# Patient Record
Sex: Male | Born: 1980 | Race: Black or African American | Hispanic: No | Marital: Single | Smoking: Current every day smoker
Health system: Southern US, Community
[De-identification: ages and names within clinical notes are randomized; demographics above are authoritative.]

## PROBLEM LIST (undated history)

## (undated) DIAGNOSIS — L509 Urticaria, unspecified: Secondary | ICD-10-CM

## (undated) DIAGNOSIS — K4090 Unilateral inguinal hernia, without obstruction or gangrene, not specified as recurrent: Secondary | ICD-10-CM

## (undated) HISTORY — PX: TENDON REPAIR: SHX5111

---

## 2009-05-15 ENCOUNTER — Emergency Department (HOSPITAL_BASED_OUTPATIENT_CLINIC_OR_DEPARTMENT_OTHER): Admission: EM | Admit: 2009-05-15 | Discharge: 2009-05-15 | Payer: Self-pay | Admitting: Emergency Medicine

## 2009-05-15 ENCOUNTER — Ambulatory Visit: Payer: Self-pay | Admitting: Radiology

## 2010-08-06 ENCOUNTER — Emergency Department (INDEPENDENT_AMBULATORY_CARE_PROVIDER_SITE_OTHER): Payer: Self-pay

## 2010-08-06 ENCOUNTER — Emergency Department (HOSPITAL_BASED_OUTPATIENT_CLINIC_OR_DEPARTMENT_OTHER)
Admission: EM | Admit: 2010-08-06 | Discharge: 2010-08-06 | Disposition: A | Discharge status: Home / Self Care | Payer: Self-pay | Attending: Emergency Medicine | Admitting: Emergency Medicine

## 2010-08-06 DIAGNOSIS — W19XXXA Unspecified fall, initial encounter: Secondary | ICD-10-CM

## 2010-08-06 DIAGNOSIS — Y9289 Other specified places as the place of occurrence of the external cause: Secondary | ICD-10-CM | POA: Insufficient documentation

## 2010-08-06 DIAGNOSIS — M25539 Pain in unspecified wrist: Secondary | ICD-10-CM

## 2010-08-06 DIAGNOSIS — S63509A Unspecified sprain of unspecified wrist, initial encounter: Secondary | ICD-10-CM | POA: Insufficient documentation

## 2010-08-16 ENCOUNTER — Emergency Department (HOSPITAL_COMMUNITY): Payer: Self-pay

## 2010-08-16 ENCOUNTER — Encounter (HOSPITAL_COMMUNITY): Payer: Self-pay

## 2010-08-16 ENCOUNTER — Emergency Department (HOSPITAL_COMMUNITY)
Admission: EM | Admit: 2010-08-16 | Discharge: 2010-08-16 | Disposition: A | Discharge status: Home / Self Care | Payer: Self-pay | Attending: Emergency Medicine | Admitting: Emergency Medicine

## 2010-08-16 DIAGNOSIS — M25579 Pain in unspecified ankle and joints of unspecified foot: Secondary | ICD-10-CM | POA: Insufficient documentation

## 2010-08-16 DIAGNOSIS — R209 Unspecified disturbances of skin sensation: Secondary | ICD-10-CM | POA: Insufficient documentation

## 2010-08-16 DIAGNOSIS — Z87828 Personal history of other (healed) physical injury and trauma: Secondary | ICD-10-CM | POA: Insufficient documentation

## 2011-01-17 ENCOUNTER — Emergency Department (HOSPITAL_COMMUNITY): Payer: Self-pay

## 2011-01-17 ENCOUNTER — Emergency Department (HOSPITAL_COMMUNITY)
Admission: EM | Admit: 2011-01-17 | Discharge: 2011-01-17 | Disposition: A | Discharge status: Home / Self Care | Payer: Self-pay | Attending: Emergency Medicine | Admitting: Emergency Medicine

## 2011-01-17 DIAGNOSIS — R509 Fever, unspecified: Secondary | ICD-10-CM | POA: Insufficient documentation

## 2011-01-17 DIAGNOSIS — R059 Cough, unspecified: Secondary | ICD-10-CM | POA: Insufficient documentation

## 2011-01-17 DIAGNOSIS — M940 Chondrocostal junction syndrome [Tietze]: Secondary | ICD-10-CM | POA: Insufficient documentation

## 2011-01-17 DIAGNOSIS — R05 Cough: Secondary | ICD-10-CM | POA: Insufficient documentation

## 2011-01-17 LAB — DIFFERENTIAL
Basophils Absolute: 0 10*3/uL (ref 0.0–0.1)
Basophils Relative: 0 % (ref 0–1)
Eosinophils Absolute: 0.1 10*3/uL (ref 0.0–0.7)
Eosinophils Relative: 1 % (ref 0–5)
Neutrophils Relative %: 78 % — ABNORMAL HIGH (ref 43–77)

## 2011-01-17 LAB — CBC
MCH: 30.9 pg (ref 26.0–34.0)
RBC: 5.21 MIL/uL (ref 4.22–5.81)
WBC: 13.9 10*3/uL — ABNORMAL HIGH (ref 4.0–10.5)

## 2011-01-17 LAB — COMPREHENSIVE METABOLIC PANEL
ALT: 11 U/L (ref 0–53)
BUN: 12 mg/dL (ref 6–23)
CO2: 28 mEq/L (ref 19–32)
Calcium: 9.3 mg/dL (ref 8.4–10.5)
Chloride: 97 mEq/L (ref 96–112)
GFR calc Af Amer: 74 mL/min — ABNORMAL LOW (ref >90)
Potassium: 4 mEq/L (ref 3.5–5.1)
Sodium: 135 mEq/L (ref 135–145)
Total Protein: 7.5 g/dL (ref 6.0–8.3)

## 2011-01-17 LAB — CK TOTAL AND CKMB (NOT AT ARMC)
CK, MB: 1.1 ng/mL (ref 0.3–4.0)
Total CK: 211 U/L (ref 7–232)

## 2011-01-17 LAB — TROPONIN I: Troponin I: <0.3 ng/mL (ref <0.30)

## 2011-01-17 MED ORDER — IOHEXOL 300 MG/ML  SOLN
80.0000 mL | Freq: Once | INTRAMUSCULAR | Status: AC | PRN
  Administered 2011-01-17: 80 mL via INTRAVENOUS

## 2011-12-29 ENCOUNTER — Encounter (HOSPITAL_COMMUNITY): Payer: Self-pay | Admitting: *Deleted

## 2011-12-29 ENCOUNTER — Emergency Department (HOSPITAL_COMMUNITY)
Admission: EM | Admit: 2011-12-29 | Discharge: 2011-12-29 | Disposition: A | Discharge status: Home / Self Care | Payer: Self-pay | Attending: Emergency Medicine | Admitting: Emergency Medicine

## 2011-12-29 DIAGNOSIS — K0889 Other specified disorders of teeth and supporting structures: Secondary | ICD-10-CM

## 2011-12-29 DIAGNOSIS — K029 Dental caries, unspecified: Secondary | ICD-10-CM | POA: Insufficient documentation

## 2011-12-29 DIAGNOSIS — F17200 Nicotine dependence, unspecified, uncomplicated: Secondary | ICD-10-CM | POA: Insufficient documentation

## 2011-12-29 MED ORDER — HYDROCODONE-ACETAMINOPHEN 5-500 MG PO TABS: 1.0000 | ORAL_TABLET | Freq: Four times a day (QID) | ORAL | Status: DC | PRN

## 2011-12-29 MED ORDER — OXYCODONE-ACETAMINOPHEN 5-325 MG PO TABS
1.0000 | ORAL_TABLET | Freq: Once | ORAL | Status: AC
  Administered 2011-12-29: 1 via ORAL

## 2011-12-29 MED ORDER — IBUPROFEN 800 MG PO TABS: 800.0000 mg | ORAL_TABLET | Freq: Three times a day (TID) | ORAL | Status: DC

## 2011-12-29 MED ORDER — PENICILLIN V POTASSIUM 500 MG PO TABS: 500.0000 mg | ORAL_TABLET | Freq: Four times a day (QID) | ORAL | Status: DC

## 2011-12-29 MED ORDER — PENICILLIN V POTASSIUM 500 MG PO TABS: 500.0000 mg | ORAL_TABLET | Freq: Four times a day (QID) | ORAL | Status: AC

## 2011-12-29 NOTE — ED Notes (Signed)
Pt c/o top left jaw toothache

## 2011-12-29 NOTE — ED Provider Notes (Signed)
Medical screening examination/treatment/procedure(s) were performed by non-physician practitioner and as supervising physician I was immediately available for consultation/collaboration.   Charles B. Bernette Mayers, MD 12/29/11 2109

## 2011-12-29 NOTE — ED Notes (Signed)
Pt c/o pain to L upper Jaw, broken teeth noted. Pt states he no longer has insurance unable to see dentist. Pt severe onset this morning.

## 2011-12-29 NOTE — ED Provider Notes (Signed)
History     CSN: [id number]  Arrival date & time 12/29/11  0630   First MD Initiated Contact with Patient 12/29/11 [phone]      Chief Complaint  Patient presents with  . Dental Pain    (Consider location/radiation/quality/duration/timing/severity/associated sxs/prior treatment) Patient is a 31 y.o. male presenting with tooth pain. The history is provided by the patient.  Dental PainThe primary symptoms include dental injury. Primary symptoms do not include mouth pain, headaches, fever, shortness of breath, sore throat or cough. The symptoms began 2 to 6 hours ago. The symptoms are worsening. The symptoms are new. The symptoms occur constantly.  Additional symptoms include: gum swelling and gum tenderness. Additional symptoms do not include: facial swelling, trouble swallowing and ear pain.  Pt with dental pain to the left upper 2nd molar onset this morning. States broke that tooth about a month ago eating peanuts. Denies facial swelling, fever, chills. Took tylenol with no relief.   History reviewed. No pertinent past medical history.  History reviewed. No pertinent past surgical history.  No family history on file.  History  Substance Use Topics  . Smoking status: Current Every Day Smoker  . Smokeless tobacco: Not on file  . Alcohol Use: Yes      Review of Systems  Constitutional: Negative for fever and chills.  HENT: Positive for dental problem. Negative for ear pain, sore throat, facial swelling and trouble swallowing.   Respiratory: Negative for cough, chest tightness and shortness of breath.   Cardiovascular: Negative.   Neurological: Negative for headaches.  Hematological: Negative for adenopathy.    Allergies  Review of patient's allergies indicates no known allergies.  Home Medications   Current Outpatient Rx  Name Route Sig Dispense Refill  . ACETAMINOPHEN 500 MG PO TABS Oral Take 1,000 mg by mouth every 6 (six) hours as needed. Pain      BP 117/82  Pulse  104  Temp 97.9 F (36.6 C)  Resp 20  SpO2 99%  Physical Exam  Vitals reviewed. Constitutional: He is oriented to person, place, and time. He appears well-developed and well-nourished. No distress.  HENT:       Poor dentition. Multiple carries. Left upper 2nd molar partially broken, tender to palpation. No facial swelling. No lymphadenopathy  Eyes: Conjunctivae normal are normal.  Neck: Neck supple.  Cardiovascular: Normal rate, regular rhythm and normal heart sounds.   Pulmonary/Chest: Effort normal and breath sounds normal. No respiratory distress. He has no wheezes. He has no rales.  Musculoskeletal: He exhibits no edema.  Neurological: He is alert and oriented to person, place, and time.  Skin: Skin is warm and dry.  Psychiatric: He has a normal mood and affect.    ED Course  Procedures (including critical care time)  Pt with dental carries, possible early abscess. Pain meds, antibiotic, follow up with a dentist.   1. Pain, dental       MDM          Lottie Mussel, PA 12/29/11 1529

## 2012-01-25 ENCOUNTER — Encounter (HOSPITAL_COMMUNITY): Payer: Self-pay | Admitting: Emergency Medicine

## 2012-01-25 ENCOUNTER — Emergency Department (HOSPITAL_COMMUNITY)
Admission: EM | Admit: 2012-01-25 | Discharge: 2012-01-25 | Disposition: A | Discharge status: Home / Self Care | Payer: Self-pay | Attending: Emergency Medicine | Admitting: Emergency Medicine

## 2012-01-25 DIAGNOSIS — K029 Dental caries, unspecified: Secondary | ICD-10-CM | POA: Insufficient documentation

## 2012-01-25 DIAGNOSIS — F17200 Nicotine dependence, unspecified, uncomplicated: Secondary | ICD-10-CM | POA: Insufficient documentation

## 2012-01-25 DIAGNOSIS — L509 Urticaria, unspecified: Secondary | ICD-10-CM | POA: Insufficient documentation

## 2012-01-25 MED ORDER — DIPHENHYDRAMINE HCL 25 MG PO TABS: 25.0000 mg | ORAL_TABLET | Freq: Four times a day (QID) | ORAL | Status: DC | PRN

## 2012-01-25 MED ORDER — PREDNISONE 20 MG PO TABS: 40.0000 mg | ORAL_TABLET | Freq: Every day | ORAL | Status: DC

## 2012-01-25 NOTE — ED Notes (Signed)
Pt alert no distress 

## 2012-01-25 NOTE — ED Notes (Signed)
Pt c/o generalized rash with itching x 1 week

## 2012-01-25 NOTE — ED Provider Notes (Signed)
History   This chart was scribed for Tobin Chad, MD by Albertha Ghee Rifaie. This patient was seen in room TR07C/TR07C and the patient's care was started at 6:10 PM.    CSN: [id number]  Arrival date & time 01/25/12  1641   None     Chief Complaint  Patient presents with  . Rash    The history is provided by the patient. No language interpreter was used.    Robert Jennings is a 31 y.o. male who presents to the Emergency Department complaining of one week of gradual onset, gradually worsening, generalized rash associated with itching. That rash started in his right thigh. He denies having similar symptoms before. He denies having any chronic health problems. Pt also denies fever, chills, SOB,  nausea, and emesis as associated symptoms. He states taking benadryl that made the rash disappear and then it came back. Pt is a current everyday smoker and occasional alcohol user.  He reports he is taking penicillin for a dental infection and has 2 days remaining of a 7 day course.  The rash was present prior to starting the PCN.  He also states he had some mouth and throat itching but only after consuming apple juice.  It resolved shortly after he noticed the symptoms.    History reviewed. No pertinent past medical history.  History reviewed. No pertinent past surgical history.  History reviewed. No pertinent family history.  History  Substance Use Topics  . Smoking status: Current Every Day Smoker  . Smokeless tobacco: Not on file  . Alcohol Use: Yes      Review of Systems  Constitutional: Negative.   HENT: Negative.  Negative for sore throat, drooling, trouble swallowing and voice change.   Eyes: Negative.   Respiratory: Negative.   Cardiovascular: Negative.   Gastrointestinal: Negative.   Genitourinary: Negative.   Musculoskeletal: Negative.   Skin: Positive for color change and rash.  Neurological: Negative.   Hematological: Negative.   Psychiatric/Behavioral: Negative.     All other systems reviewed and are negative.    Allergies  Review of patient's allergies indicates no known allergies.  Home Medications   Current Outpatient Rx  Name Route Sig Dispense Refill  . ACETAMINOPHEN 500 MG PO TABS Oral Take 1,000 mg by mouth every 6 (six) hours as needed. Pain    . HYDROCODONE-ACETAMINOPHEN 5-500 MG PO TABS Oral Take 1-2 tablets by mouth every 6 (six) hours as needed for pain. 15 tablet 0  . IBUPROFEN 800 MG PO TABS Oral Take 1 tablet (800 mg total) by mouth 3 (three) times daily. 21 tablet 0    BP 124/88  Pulse 75  Temp 98.5 F (36.9 C) (Oral)  Resp 18  SpO2 97%  Physical Exam  Nursing note and vitals reviewed. Constitutional: He is oriented to person, place, and time. He appears well-developed and well-nourished. No distress.  HENT:  Head: Normocephalic and atraumatic. Head is without raccoon's eyes, without contusion, without right periorbital erythema and without left periorbital erythema. No trismus in the jaw.  Right Ear: External ear normal. No mastoid tenderness.  Left Ear: External ear normal. No mastoid tenderness.  Nose: Nose normal. No mucosal edema or sinus tenderness. No epistaxis. Right sinus exhibits no maxillary sinus tenderness and no frontal sinus tenderness. Left sinus exhibits no maxillary sinus tenderness and no frontal sinus tenderness.  Mouth/Throat: Uvula is midline and mucous membranes are normal. Mucous membranes are not pale, not dry and not cyanotic. He does not have  dentures. No oral lesions. Abnormal dentition. Dental caries present. No dental abscesses, uvula swelling or lacerations. No oropharyngeal exudate, posterior oropharyngeal edema, posterior oropharyngeal erythema or tonsillar abscesses.  Neck: Normal range of motion. Neck supple. No JVD present. No tracheal deviation present.  Cardiovascular: Normal rate, regular rhythm, normal heart sounds and intact distal pulses.  Exam reveals no gallop.   No murmur  heard. Pulmonary/Chest: Effort normal and breath sounds normal. No stridor. No respiratory distress. He has no wheezes. He has no rales. He exhibits no tenderness.  Abdominal: Soft. Bowel sounds are normal. He exhibits no distension. There is no tenderness. There is no rebound and no guarding.  Musculoskeletal: Normal range of motion. He exhibits no edema and no tenderness.  Lymphadenopathy:    He has no cervical adenopathy.  Neurological: He is alert and oriented to person, place, and time.  Skin: Skin is warm and dry. Rash noted. No abrasion, no bruising, no burn, no ecchymosis, no laceration, no petechiae and no purpura noted. Rash is urticarial (generalized on trunck, extremities, and face.). Rash is not macular, not papular, not maculopapular, not nodular, not pustular and not vesicular. He is not diaphoretic. No erythema. No pallor.  Psychiatric: He has a normal mood and affect. His behavior is normal.    ED Course  Procedures (including critical care time)  Labs Reviewed - No data to display No results found.  DIAGNOSTIC STUDIES: Oxygen Saturation is 97% on room air, adequate by my interpretation.    COORDINATION OF CARE: 6:14 PM Discussed treatment plan with pt at bedside and pt agreed to plan.    No diagnosis found.    MDM  Pt presents for evaluation of a rash.  Note diffuse urticaria without evidence of airway involvement or anaphylaxis.  He has used benadryl and noted improvement in the itching but the rash persists.  He cannot think of any agent that may have caused the rash.  Plan prn benadryl + a prednisone pulse.  Encouraged outpt follow-up.      I personally performed the services described in this documentation, which was scribed in my presence. The recorded information has been reviewed and considered.     Tobin Chad, MD 01/25/12 1900

## 2012-02-05 ENCOUNTER — Encounter (HOSPITAL_COMMUNITY): Payer: Self-pay

## 2012-02-05 ENCOUNTER — Emergency Department (HOSPITAL_COMMUNITY)
Admission: EM | Admit: 2012-02-05 | Discharge: 2012-02-05 | Disposition: A | Discharge status: Home / Self Care | Payer: Self-pay | Attending: Emergency Medicine | Admitting: Emergency Medicine

## 2012-02-05 DIAGNOSIS — F17200 Nicotine dependence, unspecified, uncomplicated: Secondary | ICD-10-CM | POA: Insufficient documentation

## 2012-02-05 DIAGNOSIS — Z79899 Other long term (current) drug therapy: Secondary | ICD-10-CM | POA: Insufficient documentation

## 2012-02-05 DIAGNOSIS — L509 Urticaria, unspecified: Secondary | ICD-10-CM | POA: Insufficient documentation

## 2012-02-05 MED ORDER — DIPHENHYDRAMINE HCL 50 MG/ML IJ SOLN
50.0000 mg | Freq: Once | INTRAMUSCULAR | Status: AC
  Administered 2012-02-05: 50 mg via INTRAVENOUS
  Filled 2012-02-05: qty 1

## 2012-02-05 MED ORDER — PREDNISONE 10 MG PO TABS: 60.0000 mg | ORAL_TABLET | Freq: Every day | ORAL | Status: DC

## 2012-02-05 MED ORDER — PERMETHRIN 5 % EX CREA: TOPICAL_CREAM | CUTANEOUS | Status: DC

## 2012-02-05 MED ORDER — DIPHENHYDRAMINE HCL 25 MG PO CAPS: 25.0000 mg | ORAL_CAPSULE | Freq: Four times a day (QID) | ORAL | Status: DC | PRN

## 2012-02-05 MED ORDER — METHYLPREDNISOLONE SODIUM SUCC 125 MG IJ SOLR
125.0000 mg | Freq: Once | INTRAMUSCULAR | Status: AC
  Administered 2012-02-05: 125 mg via INTRAVENOUS
  Filled 2012-02-05: qty 2

## 2012-02-05 MED ORDER — FAMOTIDINE 20 MG PO TABS
20.0000 mg | ORAL_TABLET | Freq: Once | ORAL | Status: AC
  Administered 2012-02-05: 20 mg via ORAL
  Filled 2012-02-05: qty 1

## 2012-02-05 MED ORDER — FAMOTIDINE 20 MG PO TABS: 20.0000 mg | ORAL_TABLET | Freq: Two times a day (BID) | ORAL | Status: DC

## 2012-02-05 NOTE — ED Notes (Signed)
Visitor at bedside.

## 2012-02-05 NOTE — ED Notes (Signed)
Pt. Was here 2 weeks ago for these hives.  Pt. Was placed on Prednisone.  Completed yesterday and the hives came back this morning.  All over his arms,  Face,  Legs and pt. Reports  That it feels funny swallowing.  Throat is red uvula is slightly swollen. Airway patent presently

## 2012-02-05 NOTE — ED Notes (Signed)
Here several weeks ago for same problem; pt. Was prescribed prednisone and benadryl which has been ineffective.

## 2012-02-05 NOTE — ED Provider Notes (Signed)
History     CSN: [id number]  Arrival date & time 02/05/12  0865   First MD Initiated Contact with Patient 02/05/12 [phone]      Chief Complaint  Patient presents with  . Urticaria    HPI The patient reports persistent hives over the past month.  He was seen emergency department 2 weeks ago was treated with Benadryl and steroids reports marked improvement in his symptoms.  After finishing his prednisone he reports his hives returned.  He has no difficulty breathing or swallowing.  He's never had allergic reactions before.  He denies food or drug allergies.  He cannot think of any new drugs or foods that he has ingested.  Interestingly he feels like his symptoms started shortly after staying at a motel.  He does not know of any fleas or scabies or anyone else with similar symptoms.   History reviewed. No pertinent past medical history.  History reviewed. No pertinent past surgical history.  History reviewed. No pertinent family history.  History  Substance Use Topics  . Smoking status: Current Every Day Smoker  . Smokeless tobacco: Not on file  . Alcohol Use: Yes      Review of Systems  All other systems reviewed and are negative.    Allergies  Review of patient's allergies indicates no known allergies.  Home Medications   Current Outpatient Rx  Name  Route  Sig  Dispense  Refill  . ACETAMINOPHEN 500 MG PO TABS   Oral   Take 1,000 mg by mouth every 6 (six) hours as needed. Pain         . DIPHENHYDRAMINE HCL 25 MG PO TABS   Oral   Take 1 tablet (25 mg total) by mouth every 6 (six) hours as needed for itching.   20 tablet   0   . PREDNISONE 20 MG PO TABS   Oral   Take 2 tablets (40 mg total) by mouth daily. Take 40 mg by mouth daily for 3 days, then 20mg  by mouth daily for 3 days, then 10mg  daily for 3 days   12 tablet   0   . TRAMADOL HCL 50 MG PO TABS   Oral   Take 50 mg by mouth every 6 (six) hours as needed. For pain         . DIPHENHYDRAMINE HCL 25  MG PO CAPS   Oral   Take 1 capsule (25 mg total) by mouth every 6 (six) hours as needed for itching.   30 capsule   0   . FAMOTIDINE 20 MG PO TABS   Oral   Take 1 tablet (20 mg total) by mouth 2 (two) times daily.   30 tablet   0   . PERMETHRIN 5 % EX CREA      Please apply the cream to your entire body surface and leave on for 8-10 hours, then rinse off.  Please launder all clothes, towels, and bed sheets in hot water.  Repeat same procedure one week later   60 g   0   . PREDNISONE 10 MG PO TABS   Oral   Take 6 tablets (60 mg total) by mouth daily.   30 tablet   0     BP 96/61  Pulse 73  Temp 97.8 F (36.6 C) (Oral)  Resp 18  Ht 6\' 2"  (1.88 m)  Wt 200 lb (90.719 kg)  BMI 25.68 kg/m2  SpO2 100%  Physical Exam  Nursing note and vitals  reviewed. Constitutional: He is oriented to person, place, and time. He appears well-developed and well-nourished.  HENT:  Head: Normocephalic and atraumatic.  Eyes: EOM are normal.  Neck: Normal range of motion.  Cardiovascular: Normal rate, regular rhythm, normal heart sounds and intact distal pulses.   Pulmonary/Chest: Effort normal and breath sounds normal. No respiratory distress.  Abdominal: Soft. He exhibits no distension. There is no tenderness.  Musculoskeletal: Normal range of motion.  Neurological: He is alert and oriented to person, place, and time.  Skin: Skin is warm and dry.       Diffuse urticaria over his face arms chest trunk and legs.  Psychiatric: He has a normal mood and affect. Judgment normal.    ED Course  Procedures (including critical care time)  Labs Reviewed - No data to display No results found.   1. Urticaria       MDM  12:37 PM Patient has had complete resolution of his symptoms after Pepcid Benadryl and steroids.  In these areas there still are small bumps and therefore the fact that this has been persistent for one month makes me consider the possibilities of scabies her bed bugs.  I am  not completely convinced this is the cause however the patient will be treated for possible scabies exposure.  Have also recommended treatment for allergic reaction and followup within allergists.        Lyanne Co, MD 02/05/12 [phone]

## 2012-02-05 NOTE — ED Notes (Signed)
Pt. States, "stayed at dirty motel the day before spotty redness in lower legs, and now all over." Dr. Bevely Palmer ware.

## 2012-02-13 ENCOUNTER — Emergency Department (HOSPITAL_COMMUNITY)
Admission: EM | Admit: 2012-02-13 | Discharge: 2012-02-13 | Disposition: A | Discharge status: Home / Self Care | Payer: Self-pay | Attending: Emergency Medicine | Admitting: Emergency Medicine

## 2012-02-13 ENCOUNTER — Encounter (HOSPITAL_COMMUNITY): Payer: Self-pay | Admitting: *Deleted

## 2012-02-13 DIAGNOSIS — L299 Pruritus, unspecified: Secondary | ICD-10-CM | POA: Insufficient documentation

## 2012-02-13 DIAGNOSIS — L509 Urticaria, unspecified: Secondary | ICD-10-CM | POA: Insufficient documentation

## 2012-02-13 DIAGNOSIS — F17200 Nicotine dependence, unspecified, uncomplicated: Secondary | ICD-10-CM | POA: Insufficient documentation

## 2012-02-13 HISTORY — DX: Urticaria, unspecified: L50.9

## 2012-02-13 MED ORDER — PREDNISONE 20 MG PO TABS: 20.0000 mg | ORAL_TABLET | Freq: Every day | ORAL | Status: DC

## 2012-02-13 MED ORDER — HYDROXYZINE HCL 10 MG PO TABS
10.0000 mg | ORAL_TABLET | Freq: Once | ORAL | Status: AC
  Administered 2012-02-13: 10 mg via ORAL
  Filled 2012-02-13: qty 1

## 2012-02-13 MED ORDER — PREDNISONE 20 MG PO TABS
40.0000 mg | ORAL_TABLET | Freq: Once | ORAL | Status: AC
  Administered 2012-02-13: 40 mg via ORAL
  Filled 2012-02-13: qty 2

## 2012-02-13 NOTE — ED Provider Notes (Signed)
History     CSN: [id number]  Arrival date & time 02/13/12  0149   First MD Initiated Contact with Patient 02/13/12 [phone]      Chief Complaint  Patient presents with  . Urticaria  . Pruritis    (Consider location/radiation/quality/duration/timing/severity/associated sxs/prior treatment) HPI Comments: 31 year old male with a history of allergic reaction and urticaria which she has experienced over the last month. He has been to the hospital twice before for the same symptoms, no etiology was found and he has had persistent urticaria when he stops taking prednisone. His last dose of prednisone was approximately 22 hours prior to arrival, he states that this evening his symptoms recurred, he is out of his medications, he denies any swelling of his throat, wheezing, coughing. The symptoms are mild, persistent, associated with pruritus. The patient denies to me any new topical exposures, food exposures, medication exposures. He denies any intraoral lesions or optic lesions.  Patient is a 31 y.o. male presenting with urticaria. The history is provided by the patient and medical records.  Urticaria    Past Medical History  Diagnosis Date  . Urticaria     History reviewed. No pertinent past surgical history.  No family history on file.  History  Substance Use Topics  . Smoking status: Current Every Day Smoker  . Smokeless tobacco: Not on file  . Alcohol Use: Yes      Review of Systems  All other systems reviewed and are negative.    Allergies  Review of patient's allergies indicates no known allergies.  Home Medications   Current Outpatient Rx  Name  Route  Sig  Dispense  Refill  . DIPHENHYDRAMINE HCL 25 MG PO CAPS   Oral   Take 1 capsule (25 mg total) by mouth every 6 (six) hours as needed for itching.   30 capsule   0   . FAMOTIDINE 20 MG PO TABS   Oral   Take 1 tablet (20 mg total) by mouth 2 (two) times daily.   30 tablet   0   . PREDNISONE 10 MG PO TABS  Oral   Take 6 tablets (60 mg total) by mouth daily.   30 tablet   0   . PREDNISONE 20 MG PO TABS   Oral   Take 1 tablet (20 mg total) by mouth daily.   60 tablet   1     BP 130/66  Pulse 111  Temp 98.2 F (36.8 C) (Oral)  Resp 20  SpO2 95%  Physical Exam  Nursing note and vitals reviewed. Constitutional: He appears well-developed and well-nourished. No distress.  HENT:  Head: Normocephalic and atraumatic.  Mouth/Throat: Oropharynx is clear and moist. No oropharyngeal exudate.  Eyes: Conjunctivae normal and EOM are normal. Pupils are equal, round, and reactive to light. Right eye exhibits no discharge. Left eye exhibits no discharge. No scleral icterus.  Neck: Normal range of motion. Neck supple. No JVD present. No thyromegaly present.  Cardiovascular: Normal rate, regular rhythm, normal heart sounds and intact distal pulses.  Exam reveals no gallop and no friction rub.   No murmur heard. Pulmonary/Chest: Effort normal and breath sounds normal. No respiratory distress. He has no wheezes. He has no rales.  Abdominal: Soft. Bowel sounds are normal. He exhibits no distension and no mass. There is no tenderness.  Musculoskeletal: Normal range of motion. He exhibits no edema and no tenderness.  Lymphadenopathy:    He has no cervical adenopathy.  Neurological: He is alert. Coordination  normal.  Skin: Skin is warm and dry.       Scattered small urticarial lesions across his trunk, upper extremities and back.  Psychiatric: He has a normal mood and affect. His behavior is normal.    ED Course  Procedures (including critical care time)  Labs Reviewed - No data to display No results found.   1. Urticaria       MDM  Overall the patient is well-appearing, his heart rate has reduced to 90 on my exam, he has no intraoral symptoms, no pulmonary symptoms and has only skin manifestations of what appears to be an urticarial reaction. Prednisone given, allergist recommended and  strongly urged the patient to followup this week. The patient has expressed his understanding for the indications for return to        Vida Roller, MD 02/13/12 [phone]

## 2012-02-13 NOTE — ED Notes (Addendum)
C/o hives and itching, ongoing intermitantly for ~ 1 month, has been seen x2 before in this ED, not able to f/u with allergist, has been taking prednisone (finished), and benadryl and pepcid. meds wear off and hives and itching return. This episode stareed ~ 2 hrs ago. Last pepcid and benadryl at 0000 am. Unsure of allergen. Pt scratching. States, "throat itching a little, still feels like I need to swallow food but I ate 2 hrs ago". No swelling noted in throat. Throat red irritated, no exudate. Scant blisters or urticaria in throat noted. LS CTA.

## 2012-02-13 NOTE — ED Notes (Signed)
Pt states that he has no changes to daily routine such as detergants, body wash, foods etc.pt states that he started noticing a rash and it itched. Pt denies difficulty breathing or throat tightness.

## 2012-07-04 ENCOUNTER — Encounter (HOSPITAL_COMMUNITY): Payer: Self-pay | Admitting: Emergency Medicine

## 2012-07-04 DIAGNOSIS — N509 Disorder of male genital organs, unspecified: Secondary | ICD-10-CM | POA: Insufficient documentation

## 2012-07-04 NOTE — ED Notes (Addendum)
R sided testicle pain and swelling since Sunday.  Denies discharge.

## 2012-07-05 ENCOUNTER — Emergency Department (HOSPITAL_BASED_OUTPATIENT_CLINIC_OR_DEPARTMENT_OTHER): Payer: Self-pay

## 2012-07-05 ENCOUNTER — Emergency Department (HOSPITAL_BASED_OUTPATIENT_CLINIC_OR_DEPARTMENT_OTHER)
Admission: EM | Admit: 2012-07-05 | Discharge: 2012-07-05 | Disposition: A | Discharge status: Home / Self Care | Payer: Self-pay | Attending: Emergency Medicine | Admitting: Emergency Medicine

## 2012-07-05 ENCOUNTER — Emergency Department (HOSPITAL_COMMUNITY)
Admission: EM | Admit: 2012-07-05 | Discharge: 2012-07-05 | Discharge status: Against Medical Advice | Payer: Self-pay | Attending: Emergency Medicine | Admitting: Emergency Medicine

## 2012-07-05 ENCOUNTER — Encounter (HOSPITAL_BASED_OUTPATIENT_CLINIC_OR_DEPARTMENT_OTHER): Payer: Self-pay | Admitting: *Deleted

## 2012-07-05 DIAGNOSIS — Z79899 Other long term (current) drug therapy: Secondary | ICD-10-CM | POA: Insufficient documentation

## 2012-07-05 DIAGNOSIS — Z872 Personal history of diseases of the skin and subcutaneous tissue: Secondary | ICD-10-CM | POA: Insufficient documentation

## 2012-07-05 DIAGNOSIS — F17200 Nicotine dependence, unspecified, uncomplicated: Secondary | ICD-10-CM | POA: Insufficient documentation

## 2012-07-05 DIAGNOSIS — IMO0002 Reserved for concepts with insufficient information to code with codable children: Secondary | ICD-10-CM | POA: Insufficient documentation

## 2012-07-05 DIAGNOSIS — N509 Disorder of male genital organs, unspecified: Secondary | ICD-10-CM | POA: Insufficient documentation

## 2012-07-05 DIAGNOSIS — N452 Orchitis: Secondary | ICD-10-CM | POA: Insufficient documentation

## 2012-07-05 DIAGNOSIS — N5089 Other specified disorders of the male genital organs: Secondary | ICD-10-CM | POA: Insufficient documentation

## 2012-07-05 DIAGNOSIS — N453 Epididymo-orchitis: Secondary | ICD-10-CM

## 2012-07-05 LAB — URINALYSIS, ROUTINE W REFLEX MICROSCOPIC
Bilirubin Urine: NEGATIVE
Specific Gravity, Urine: 1.024 (ref 1.005–1.030)
pH: 6 (ref 5.0–8.0)

## 2012-07-05 LAB — URINE MICROSCOPIC-ADD ON

## 2012-07-05 MED ORDER — DOXYCYCLINE HYCLATE 100 MG PO CAPS: 100.0000 mg | ORAL_CAPSULE | Freq: Two times a day (BID) | ORAL | Status: DC

## 2012-07-05 MED ORDER — OXYCODONE-ACETAMINOPHEN 5-325 MG PO TABS
2.0000 | ORAL_TABLET | Freq: Once | ORAL | Status: AC
  Administered 2012-07-05: 2 via ORAL
  Filled 2012-07-05 (×2): qty 2

## 2012-07-05 MED ORDER — CEFTRIAXONE SODIUM 250 MG IJ SOLR
250.0000 mg | Freq: Once | INTRAMUSCULAR | Status: AC
  Administered 2012-07-05: 250 mg via INTRAMUSCULAR
  Filled 2012-07-05: qty 250

## 2012-07-05 MED ORDER — OXYCODONE-ACETAMINOPHEN 5-325 MG PO TABS: 1.0000 | ORAL_TABLET | Freq: Four times a day (QID) | ORAL | Status: DC | PRN

## 2012-07-05 NOTE — ED Notes (Signed)
Pt did not answer when called

## 2012-07-05 NOTE — ED Notes (Addendum)
Woke with groin pain. Right testicle is swollen and warm to touch.

## 2012-07-05 NOTE — ED Provider Notes (Signed)
History     CSN: [id number]  Arrival date & time 07/05/12  1524   First MD Initiated Contact with Patient 07/05/12 1547      Chief Complaint  Patient presents with  . Groin Pain    (Consider location/radiation/quality/duration/timing/severity/associated sxs/prior treatment) Patient is a 32 y.o. male presenting with groin pain.  Groin Pain   Pt reports 5 days of pain and swelling in R testicle. Initially pain was mild but has become more severe. Worse with palpation, and walking. Pain is radiating into his abdomen. Denies fever, vomiting, dysuria, hematuria or penile discharge. He noticed one episode of brown colored semen last week. Reports sexually active, unprotected intercourse, no prior history of STD treatment.   Past Medical History  Diagnosis Date  . Urticaria     History reviewed. No pertinent past surgical history.  No family history on file.  History  Substance Use Topics  . Smoking status: Current Every Day Smoker  . Smokeless tobacco: Not on file  . Alcohol Use: Yes      Review of Systems All other systems reviewed and are negative except as noted in HPI.   Allergies  Review of patient's allergies indicates no known allergies.  Home Medications   Current Outpatient Rx  Name  Route  Sig  Dispense  Refill  . TRAMADOL HCL PO   Oral   Take by mouth.         . diphenhydrAMINE (BENADRYL) 25 mg capsule   Oral   Take 1 capsule (25 mg total) by mouth every 6 (six) hours as needed for itching.   30 capsule   0   . famotidine (PEPCID) 20 MG tablet   Oral   Take 1 tablet (20 mg total) by mouth 2 (two) times daily.   30 tablet   0   . predniSONE (DELTASONE) 10 MG tablet   Oral   Take 6 tablets (60 mg total) by mouth daily.   30 tablet   0   . predniSONE (DELTASONE) 20 MG tablet   Oral   Take 1 tablet (20 mg total) by mouth daily.   60 tablet   1     There were no vitals taken for this visit.  Physical Exam  Nursing note and vitals  reviewed. Constitutional: He is oriented to person, place, and time. He appears well-developed and well-nourished.  HENT:  Head: Normocephalic and atraumatic.  Eyes: EOM are normal. Pupils are equal, round, and reactive to light.  Neck: Normal range of motion. Neck supple.  Cardiovascular: Normal rate, normal heart sounds and intact distal pulses.   Pulmonary/Chest: Effort normal and breath sounds normal.  Abdominal: Bowel sounds are normal. He exhibits no distension. There is no tenderness.  Genitourinary:  Markedly swollen and tender R testicle and epididymis. Normally located, positive cremasteric reflex; no penile discharge or sores. Moderate inguinal lymphadenopathy R>L; L testicle is normal  Musculoskeletal: Normal range of motion. He exhibits no edema and no tenderness.  Neurological: He is alert and oriented to person, place, and time. He has normal strength. No cranial nerve deficit or sensory deficit.  Skin: Skin is warm and dry. No rash noted.  Psychiatric: He has a normal mood and affect.    ED Course  Procedures (including critical care time)  Labs Reviewed  URINALYSIS, ROUTINE W REFLEX MICROSCOPIC - Abnormal; Notable for the following:    Hgb urine dipstick MODERATE (*)    Leukocytes, UA MODERATE (*)    All other components  within normal limits  GC/CHLAMYDIA PROBE AMP  URINE MICROSCOPIC-ADD ON   US Scrotum  07/05/2012  *RADIOLOGY REPORT*  Clinical Data:  Right testicular pain/swelling  SCROTAL ULTRASOUND DOPPLER ULTRASOUND OF THE TESTICLES  Technique: Complete ultrasound examination of the testicles, epididymis, and other scrotal structures was performed.  Color and spectral Doppler ultrasound were also utilized to evaluate blood flow to the testicles.  Comparison:  None.  Findings:  Right testis:  Measures 3.9 x 2.3 x 3.1 cm.  Hypervascular relative to the left.  Left testis:  Normal in size and appearance, measuring 3.8 x 2.6 x 2.7 cm.  Right epididymis:  Heterogeneous  and hypervascular.  Left epididymis:  Normal in size and appearance.  Hydrocele:  Small right hydrocele.  Physiologic fluid on the left.  Varicocele:  Absent.  Pulsed Doppler interrogation of both testes demonstrates low resistance flow bilaterally.  IMPRESSION: Right epididymoorchitis.  No evidence of testicular torsion.   Original Report Authenticated By: Charline Bills, M.D.    [place] Art/ven Flow Abd Pelv Doppler  07/05/2012  *RADIOLOGY REPORT*  Clinical Data:  Right testicular pain/swelling  SCROTAL ULTRASOUND DOPPLER ULTRASOUND OF THE TESTICLES  Technique: Complete ultrasound examination of the testicles, epididymis, and other scrotal structures was performed.  Color and spectral Doppler ultrasound were also utilized to evaluate blood flow to the testicles.  Comparison:  None.  Findings:  Right testis:  Measures 3.9 x 2.3 x 3.1 cm.  Hypervascular relative to the left.  Left testis:  Normal in size and appearance, measuring 3.8 x 2.6 x 2.7 cm.  Right epididymis:  Heterogeneous and hypervascular.  Left epididymis:  Normal in size and appearance.  Hydrocele:  Small right hydrocele.  Physiologic fluid on the left.  Varicocele:  Absent.  Pulsed Doppler interrogation of both testes demonstrates low resistance flow bilaterally.  IMPRESSION: Right epididymoorchitis.  No evidence of testicular torsion.   Original Report Authenticated By: Charline Bills, M.D.      1. Epididymo-orchitis, acute       MDM  Epididymo-orchitis, suspect STD given age and recent hematospermia. UA sent for culture and GC/C DNA probe. Rocephin here and doxycycline for 2 weeks. Advised to elevate and support scrotum. Urology followup if not improving.         Charles B. Bernette Mayers, MD 07/05/12 1730

## 2012-07-05 NOTE — ED Notes (Signed)
Pt. Reports pain in his lower R abd. Also.  Pt. Reports his semen was discolored last wk.

## 2014-09-15 ENCOUNTER — Encounter (HOSPITAL_COMMUNITY): Payer: Self-pay | Admitting: *Deleted

## 2014-09-15 ENCOUNTER — Emergency Department (HOSPITAL_COMMUNITY)
Admission: EM | Admit: 2014-09-15 | Discharge: 2014-09-15 | Disposition: A | Discharge status: Home / Self Care | Payer: Self-pay | Attending: Emergency Medicine | Admitting: Emergency Medicine

## 2014-09-15 DIAGNOSIS — Z720 Tobacco use: Secondary | ICD-10-CM | POA: Insufficient documentation

## 2014-09-15 DIAGNOSIS — K4090 Unilateral inguinal hernia, without obstruction or gangrene, not specified as recurrent: Secondary | ICD-10-CM | POA: Insufficient documentation

## 2014-09-15 DIAGNOSIS — Z872 Personal history of diseases of the skin and subcutaneous tissue: Secondary | ICD-10-CM | POA: Insufficient documentation

## 2014-09-15 LAB — BASIC METABOLIC PANEL
ANION GAP: 9 (ref 5–15)
BUN: 9 mg/dL (ref 6–20)
CHLORIDE: 98 mmol/L — AB (ref 101–111)
CO2: 28 mmol/L (ref 22–32)
CREATININE: 1.6 mg/dL — AB (ref 0.61–1.24)
Calcium: 8.8 mg/dL — ABNORMAL LOW (ref 8.9–10.3)
GFR calc Af Amer: >60 mL/min (ref >60)
GFR calc non Af Amer: 55 mL/min — ABNORMAL LOW (ref >60)
Glucose, Bld: 176 mg/dL — ABNORMAL HIGH (ref 65–99)
POTASSIUM: 3.2 mmol/L — AB (ref 3.5–5.1)
Sodium: 135 mmol/L (ref 135–145)

## 2014-09-15 LAB — CBC WITH DIFFERENTIAL/PLATELET
Basophils Absolute: 0 10*3/uL (ref 0.0–0.1)
Basophils Relative: 0 % (ref 0–1)
EOS ABS: 0.5 10*3/uL (ref 0.0–0.7)
Eosinophils Relative: 4 % (ref 0–5)
HEMATOCRIT: 42.5 % (ref 39.0–52.0)
Hemoglobin: 14.3 g/dL (ref 13.0–17.0)
LYMPHS ABS: 2.4 10*3/uL (ref 0.7–4.0)
LYMPHS PCT: 20 % (ref 12–46)
MCH: 28.1 pg (ref 26.0–34.0)
MCHC: 33.6 g/dL (ref 30.0–36.0)
MCV: 83.7 fL (ref 78.0–100.0)
Monocytes Absolute: 0.9 10*3/uL (ref 0.1–1.0)
Monocytes Relative: 8 % (ref 3–12)
NEUTROS PCT: 68 % (ref 43–77)
Neutro Abs: 8 10*3/uL — ABNORMAL HIGH (ref 1.7–7.7)
PLATELETS: 291 10*3/uL (ref 150–400)
RBC: 5.08 MIL/uL (ref 4.22–5.81)
RDW: 14.5 % (ref 11.5–15.5)
WBC: 11.8 10*3/uL — ABNORMAL HIGH (ref 4.0–10.5)

## 2014-09-15 LAB — URINE MICROSCOPIC-ADD ON

## 2014-09-15 LAB — URINALYSIS, ROUTINE W REFLEX MICROSCOPIC
BILIRUBIN URINE: NEGATIVE
Glucose, UA: NEGATIVE mg/dL
HGB URINE DIPSTICK: NEGATIVE
KETONES UR: NEGATIVE mg/dL
NITRITE: NEGATIVE
PROTEIN: 30 mg/dL — AB
SPECIFIC GRAVITY, URINE: 1.023 (ref 1.005–1.030)
UROBILINOGEN UA: 0.2 mg/dL (ref 0.0–1.0)
pH: 5.5 (ref 5.0–8.0)

## 2014-09-15 LAB — GC/CHLAMYDIA PROBE AMP (~~LOC~~) NOT AT ARMC
Chlamydia: NEGATIVE
Neisseria Gonorrhea: NEGATIVE

## 2014-09-15 LAB — LACTIC ACID, PLASMA: Lactic Acid, Venous: 2.6 mmol/L (ref 0.5–2.0)

## 2014-09-15 MED ORDER — AZITHROMYCIN 250 MG PO TABS
1000.0000 mg | ORAL_TABLET | Freq: Once | ORAL | Status: AC
  Administered 2014-09-15: 1000 mg via ORAL
  Filled 2014-09-15: qty 4

## 2014-09-15 MED ORDER — CEFTRIAXONE SODIUM 250 MG IJ SOLR
250.0000 mg | Freq: Once | INTRAMUSCULAR | Status: AC
  Administered 2014-09-15: 250 mg via INTRAMUSCULAR
  Filled 2014-09-15: qty 250

## 2014-09-15 MED ORDER — DIPHENHYDRAMINE HCL 25 MG PO CAPS
25.0000 mg | ORAL_CAPSULE | Freq: Once | ORAL | Status: AC
  Administered 2014-09-15: 25 mg via ORAL
  Filled 2014-09-15: qty 1

## 2014-09-15 MED ORDER — OXYCODONE-ACETAMINOPHEN 5-325 MG PO TABS
2.0000 | ORAL_TABLET | Freq: Once | ORAL | Status: AC
  Administered 2014-09-15: 2 via ORAL
  Filled 2014-09-15: qty 2

## 2014-09-15 MED ORDER — DOXYCYCLINE HYCLATE 100 MG PO CAPS: 100.0000 mg | ORAL_CAPSULE | Freq: Two times a day (BID) | ORAL | Status: DC

## 2014-09-15 NOTE — Discharge Instructions (Signed)
Hernia A hernia occurs when an internal organ pushes out through a weak spot in the abdominal wall. Hernias most commonly occur in the groin and around the navel. Hernias often can be pushed back into place (reduced). Most hernias tend to get worse over time. Some abdominal hernias can get stuck in the opening (irreducible or incarcerated hernia) and cannot be reduced. An irreducible abdominal hernia which is tightly squeezed into the opening is at risk for impaired blood supply (strangulated hernia). A strangulated hernia is a medical emergency. Because of the risk for an irreducible or strangulated hernia, surgery may be recommended to repair a hernia. CAUSES   Heavy lifting.  Prolonged coughing.  Straining to have a bowel movement.  A cut (incision) made during an abdominal surgery. HOME CARE INSTRUCTIONS   Bed rest is not required. You may continue your normal activities.  Avoid lifting more than 10 pounds (4.5 kg) or straining.  Cough gently. If you are a smoker it is best to stop. Even the best hernia repair can break down with the continual strain of coughing. Even if you do not have your hernia repaired, a cough will continue to aggravate the problem.  Do not wear anything tight over your hernia. Do not try to keep it in with an outside bandage or truss. These can damage abdominal contents if they are trapped within the hernia sac.  Eat a normal diet.  Avoid constipation. Straining over long periods of time will increase hernia size and encourage breakdown of repairs. If you cannot do this with diet alone, stool softeners may be used. SEEK IMMEDIATE MEDICAL CARE IF:   You have a fever.  You develop increasing abdominal pain.  You feel nauseous or vomit.  Your hernia is stuck outside the abdomen, looks discolored, feels hard, or is tender.  You have any changes in your bowel habits or in the hernia that are unusual for you.  You have increased pain or swelling around the  hernia.  You cannot push the hernia back in place by applying gentle pressure while lying down. MAKE SURE YOU:   Understand these instructions.  Will watch your condition.  Will get help right away if you are not doing well or get worse. Document Released: 03/14/2005 Document Revised: 06/06/2011 Document Reviewed: 11/01/2007 [facility] Patient Information 2015 [place], [place]. This information is not intended to replace advice given to you by your health care provider. Make sure you discuss any questions you have with your health care provider. Hematuria Hematuria is blood in your urine. It can be caused by a bladder infection, kidney infection, prostate infection, kidney stone, or cancer of your urinary tract. Infections can usually be treated with medicine, and a kidney stone usually will pass through your urine. If neither of these is the cause of your hematuria, further workup to find out the reason may be needed. It is very important that you tell your health care provider about any blood you see in your urine, even if the blood stops without treatment or happens without causing pain. Blood in your urine that happens and then stops and then happens again can be a symptom of a very serious condition. Also, pain is not a symptom in the initial stages of many urinary cancers. HOME CARE INSTRUCTIONS   Drink lots of fluid, 3-4 quarts a day. If you have been diagnosed with an infection, cranberry juice is especially recommended, in addition to large amounts of water.  Avoid caffeine, tea, and carbonated beverages  because they tend to irritate the bladder.  Avoid alcohol because it may irritate the prostate.  Take all medicines as directed by your health care provider.  If you were prescribed an antibiotic medicine, finish it all even if you start to feel better.  If you have been diagnosed with a kidney stone, follow your health care provider's instructions regarding straining your urine to  catch the stone.  Empty your bladder often. Avoid holding urine for long periods of time.  After a bowel movement, women should cleanse front to back. Use each tissue only once.  Empty your bladder before and after sexual intercourse if you are a male. SEEK MEDICAL CARE IF:  You develop back pain.  You have a fever.  You have a feeling of sickness in your stomach (nausea) or vomiting.  Your symptoms are not better in 3 days. Return sooner if you are getting worse. SEEK IMMEDIATE MEDICAL CARE IF:   You develop severe vomiting and are unable to keep the medicine down.  You develop severe back or abdominal pain despite taking your medicines.  You begin passing a large amount of blood or clots in your urine.  You feel extremely weak or faint, or you pass out. MAKE SURE YOU:   Understand these instructions.  Will watch your condition.  Will get help right away if you are not doing well or get worse. Document Released: 03/14/2005 Document Revised: 07/29/2013 Document Reviewed: 11/12/2012 [facility] Patient Information 2015 [place], [place]. This information is not intended to replace advice given to you by your health care provider. Make sure you discuss any questions you have with your health care provider.

## 2014-09-15 NOTE — ED Notes (Signed)
The pt has had groin swelling on the rt for awhile  For one week i has been  Worse.  He has been pushng it back inward but it is moire painful now and cannot be pushed backward

## 2014-09-15 NOTE — ED Provider Notes (Signed)
CSN: [id number]     Arrival date & time 09/15/14  0225 History   First MD Initiated Contact with Patient 09/15/14 0245     Chief Complaint  Patient presents with  . Groin Pain     (Consider location/radiation/quality/duration/timing/severity/associated sxs/prior Treatment) Patient is a 34 y.o. male presenting with groin pain.  Groin Pain This is a chronic problem. Episode onset: 1 year ago. The problem occurs constantly. The problem has been gradually worsening. Associated symptoms include abdominal pain. Pertinent negatives include no chest pain, no headaches and no shortness of breath. Nothing aggravates the symptoms. Nothing relieves the symptoms. He has tried nothing for the symptoms.    Past Medical History  Diagnosis Date  . Urticaria    History reviewed. No pertinent past surgical history. No family history on file. History  Substance Use Topics  . Smoking status: Current Every Day Smoker  . Smokeless tobacco: Not on file  . Alcohol Use: Yes    Review of Systems  Respiratory: Negative for shortness of breath.   Cardiovascular: Negative for chest pain.  Gastrointestinal: Positive for abdominal pain.  Neurological: Negative for headaches.  All other systems reviewed and are negative.     Allergies  Acetaminophen  Home Medications   Prior to Admission medications   Medication Sig Start Date End Date Taking? Authorizing Provider  doxycycline (VIBRAMYCIN) 100 MG capsule Take 1 capsule (100 mg total) by mouth 2 (two) times daily. 09/15/14   Mirian Mo, MD  oxyCODONE-acetaminophen (PERCOCET/ROXICET) 5-325 MG per tablet Take 1-2 tablets by mouth every 6 (six) hours as needed for pain. Patient not taking: Reported on 09/15/2014 07/05/12   Susy Frizzle, MD   BP 119/66 mmHg  Pulse 101  Temp(Src) 98.2 F (36.8 C) (Oral)  Resp 18  Ht 6\' 2"  (1.88 m)  Wt 177 lb 3 oz (80.372 kg)  BMI 22.74 kg/m2  SpO2 100% Physical Exam  Constitutional: He is oriented to person,  place, and time. He appears well-developed and well-nourished.  HENT:  Head: Normocephalic and atraumatic.  Eyes: Conjunctivae and EOM are normal.  Neck: Normal range of motion. Neck supple.  Cardiovascular: Normal rate, regular rhythm and normal heart sounds.   Pulmonary/Chest: Effort normal and breath sounds normal. No respiratory distress.  Abdominal: He exhibits no distension. There is no tenderness. There is no rebound and no guarding. A hernia is present. Hernia confirmed positive in the right inguinal area (inguinal). Hernia confirmed negative in the left inguinal area.  Genitourinary: Testes normal.  Musculoskeletal: Normal range of motion.  Lymphadenopathy:       Right: Inguinal adenopathy present.       Left: Inguinal adenopathy present.  Neurological: He is alert and oriented to person, place, and time.  Skin: Skin is warm and dry.  Vitals reviewed.   ED Course  Procedures (including critical care time) Labs Review Labs Reviewed  URINALYSIS, ROUTINE W REFLEX MICROSCOPIC (NOT AT [facility]) - Abnormal; Notable for the following:    Protein, ur 30 (*)    Leukocytes, UA SMALL (*)    All other components within normal limits  CBC WITH DIFFERENTIAL/PLATELET - Abnormal; Notable for the following:    WBC 11.8 (*)    Neutro Abs 8.0 (*)    All other components within normal limits  LACTIC ACID, PLASMA - Abnormal; Notable for the following:    Lactic Acid, Venous 2.6 (*)    All other components within normal limits  BASIC METABOLIC PANEL - Abnormal; Notable for the following:  Potassium 3.2 (*)    Chloride 98 (*)    Glucose, Bld 176 (*)    Creatinine, Ser 1.60 (*)    Calcium 8.8 (*)    GFR calc non Af Amer 55 (*)    All other components within normal limits  URINE MICROSCOPIC-ADD ON  LACTIC ACID, PLASMA  GC/CHLAMYDIA PROBE AMP ([place]) NOT AT [facility]    Imaging Review No results found.   EKG Interpretation None      MDM   Final diagnoses:  Unilateral inguinal  hernia without obstruction or gangrene, recurrence not specified    34 y.o. male with pertinent PMH of prior STI presents with R inguinal hernia which became more painful and n/v beginning today.  Pt unable to reduce hernia, so presents today.  He also complains of hematuria.  On arrival vitals and physical exam as above.  Reduced R inguinal hernia without difficulty.  Pt had relief of pain as a result.  Wu as above with elevated lactic acid, suspect this is likely from incarcerated hernia which is now relieved.  Discussed his cr elevation at length and stressed fu with pcp.  Will treat empirically for STI given his history with rocephin and azithro, and sent home with doxy.  Also given number for [facility] for outpt surgery.  I have reviewed all laboratory and imaging studies if ordered as above  1. Unilateral inguinal hernia without obstruction or gangrene, recurrence not specified         Mirian Mo, MD 09/15/14 [phone]

## 2014-09-15 NOTE — ED Notes (Signed)
Reviewed prior med admin and noted allergy to acetaminophen. Pt reports itching and flush feeling. [facility] notified, order placed for benadryl. Pt in NAD, no respiratory distress noted.

## 2014-09-15 NOTE — ED Notes (Signed)
Reported lactic acid of 2.6 to Dr. Littie Deeds. MD acknowledges, no new orders at this time.

## 2014-10-23 ENCOUNTER — Emergency Department (HOSPITAL_COMMUNITY): Payer: Self-pay

## 2014-10-23 ENCOUNTER — Encounter (HOSPITAL_COMMUNITY): Payer: Self-pay | Admitting: *Deleted

## 2014-10-23 ENCOUNTER — Emergency Department (HOSPITAL_COMMUNITY)
Admission: EM | Admit: 2014-10-23 | Discharge: 2014-10-23 | Disposition: A | Discharge status: Home / Self Care | Payer: Self-pay | Attending: Emergency Medicine | Admitting: Emergency Medicine

## 2014-10-23 DIAGNOSIS — Y998 Other external cause status: Secondary | ICD-10-CM | POA: Insufficient documentation

## 2014-10-23 DIAGNOSIS — L03012 Cellulitis of left finger: Secondary | ICD-10-CM | POA: Insufficient documentation

## 2014-10-23 DIAGNOSIS — Z792 Long term (current) use of antibiotics: Secondary | ICD-10-CM | POA: Insufficient documentation

## 2014-10-23 DIAGNOSIS — Y9389 Activity, other specified: Secondary | ICD-10-CM | POA: Insufficient documentation

## 2014-10-23 DIAGNOSIS — Z720 Tobacco use: Secondary | ICD-10-CM | POA: Insufficient documentation

## 2014-10-23 DIAGNOSIS — W2209XA Striking against other stationary object, initial encounter: Secondary | ICD-10-CM | POA: Insufficient documentation

## 2014-10-23 DIAGNOSIS — S60222A Contusion of left hand, initial encounter: Secondary | ICD-10-CM | POA: Insufficient documentation

## 2014-10-23 DIAGNOSIS — Y9289 Other specified places as the place of occurrence of the external cause: Secondary | ICD-10-CM | POA: Insufficient documentation

## 2014-10-23 DIAGNOSIS — IMO0001 Reserved for inherently not codable concepts without codable children: Secondary | ICD-10-CM

## 2014-10-23 MED ORDER — HYDROCODONE-IBUPROFEN 7.5-200 MG PO TABS: 1.0000 | ORAL_TABLET | Freq: Four times a day (QID) | ORAL | Status: DC | PRN

## 2014-10-23 MED ORDER — SULFAMETHOXAZOLE-TRIMETHOPRIM 800-160 MG PO TABS: 1.0000 | ORAL_TABLET | Freq: Two times a day (BID) | ORAL | Status: AC

## 2014-10-23 NOTE — ED Provider Notes (Signed)
CSN: [id number]     Arrival date & time 10/23/14  1753 History  This chart was scribed for Langston Masker, PA-C working with Gwyneth Sprout, MD by Elveria Rising, ED Scribe. This patient was seen in room TR05C/TR05C and the patient's care was started at 7:59 PM.   Chief Complaint  Patient presents with  . Hand Injury   The history is provided by the patient. No language interpreter was used.   HPI Comments: Robert Jennings is a 34 y.o. male who presents to the Emergency Department complaining of left hand swelling to distal left third finger for several days now. Patient reports biting a hang nail prior to onset of his swelling and then worsening after banging in his hand into a wall. Patient reports decreased sensation extending distally from PIP joint. Patient also complains of pain to left thumb. No specific treatment at home.   Past Medical History  Diagnosis Date  . Urticaria    History reviewed. No pertinent past surgical history. No family history on file. History  Substance Use Topics  . Smoking status: Current Every Day Smoker  . Smokeless tobacco: Not on file  . Alcohol Use: Yes    Review of Systems  Musculoskeletal:       Finger swelling  All other systems reviewed and are negative.     Allergies  Acetaminophen  Home Medications   Prior to Admission medications   Medication Sig Start Date End Date Taking? Authorizing Provider  doxycycline (VIBRAMYCIN) 100 MG capsule Take 1 capsule (100 mg total) by mouth 2 (two) times daily. 09/15/14   Mirian Mo, MD  oxyCODONE-acetaminophen (PERCOCET/ROXICET) 5-325 MG per tablet Take 1-2 tablets by mouth every 6 (six) hours as needed for pain. Patient not taking: Reported on 09/15/2014 07/05/12   Susy Frizzle, MD   Triage Vitals: BP 114/72 mmHg  Pulse 87  Temp(Src) 98.1 F (36.7 C) (Oral)  Resp 16  Ht 6\' 2"  (1.88 m)  Wt 180 lb 8 oz (81.874 kg)  BMI 23.16 kg/m2  SpO2 100% Physical Exam  Constitutional: He is oriented to  person, place, and time. He appears well-developed and well-nourished. No distress.  HENT:  Head: Normocephalic and atraumatic.  Eyes: EOM are normal.  Neck: Neck supple. No tracheal deviation present.  Cardiovascular: Normal rate.   Pulmonary/Chest: Effort normal. No respiratory distress.  Musculoskeletal: Normal range of motion.  Neurological: He is alert and oriented to person, place, and time.  Skin: Skin is warm and dry.  Psychiatric: He has a normal mood and affect. His behavior is normal.  Nursing note and vitals reviewed.   ED Course  Procedures (including critical care time)  INCISION AND DRAINAGE PROCEDURE NOTE: Patient identification was confirmed and verbal consent was obtained. This procedure was performed by Langston Masker, PA-C at 8:05 PM. Site: left third finger Sterile procedures observed Needle size: 18 gauge  Anesthetic used (type and amt): freeze spray Drainage: large amount of pus Complexity: Complex Site anesthetized, incision made over site, wound drained and explored loculations, rinsed with copious amounts of normal saline, wound packed with sterile gauze, covered with dry, sterile dressing.  Pt tolerated procedure well without complications.  Instructions for care discussed verbally and pt provided with additional written instructions for homecare and f/u.   COORDINATION OF CARE: 8:03 PM- Plans to incise and drain paronychia. Discussed treatment plan with patient at bedside and patient agreed to plan.   Labs Review Labs Reviewed - No data to display  Imaging Review No  results found.   EKG Interpretation None      MDM   Final diagnoses:  Paronychia of third finger, left  Contusion, hand, left, initial encounter   Bactrim hydrocodone  I personally performed the services in this documentation, which was scribed in my presence.  The recorded information has been reviewed and considered.   Barnet Pall.   Lonia Skinner [place], PA-C 10/24/14  1642  Gwyneth Sprout, MD 10/28/14 [phone]

## 2014-10-23 NOTE — ED Notes (Signed)
Pt reports hitting a wall several days ago. Pt noted to have swelling to left hand.

## 2014-10-23 NOTE — ED Notes (Signed)
Patient said it started with a hang nail and it has gotten infected after he punched a wall.

## 2014-10-23 NOTE — ED Notes (Signed)
Patient is alert and orientedx4.  Patient was explained discharge instructions and they understood them with no questions.   

## 2014-10-23 NOTE — Discharge Instructions (Signed)

## 2015-02-14 ENCOUNTER — Emergency Department (HOSPITAL_COMMUNITY)
Admission: EM | Admit: 2015-02-14 | Discharge: 2015-02-14 | Discharge status: Against Medical Advice | Payer: Self-pay | Attending: Emergency Medicine | Admitting: Emergency Medicine

## 2015-02-14 ENCOUNTER — Encounter (HOSPITAL_COMMUNITY): Payer: Self-pay | Admitting: Emergency Medicine

## 2015-02-14 DIAGNOSIS — Z792 Long term (current) use of antibiotics: Secondary | ICD-10-CM | POA: Insufficient documentation

## 2015-02-14 DIAGNOSIS — M79604 Pain in right leg: Secondary | ICD-10-CM | POA: Insufficient documentation

## 2015-02-14 DIAGNOSIS — K4091 Unilateral inguinal hernia, without obstruction or gangrene, recurrent: Secondary | ICD-10-CM | POA: Insufficient documentation

## 2015-02-14 DIAGNOSIS — Z872 Personal history of diseases of the skin and subcutaneous tissue: Secondary | ICD-10-CM | POA: Insufficient documentation

## 2015-02-14 DIAGNOSIS — F17200 Nicotine dependence, unspecified, uncomplicated: Secondary | ICD-10-CM | POA: Insufficient documentation

## 2015-02-14 HISTORY — DX: Unilateral inguinal hernia, without obstruction or gangrene, not specified as recurrent: K40.90

## 2015-02-14 LAB — CBC WITH DIFFERENTIAL/PLATELET
BASOS ABS: 0 10*3/uL (ref 0.0–0.1)
Basophils Relative: 0 %
EOS ABS: 0.5 10*3/uL (ref 0.0–0.7)
Eosinophils Relative: 4 %
HCT: 47.5 % (ref 39.0–52.0)
Hemoglobin: 16.2 g/dL (ref 13.0–17.0)
LYMPHS ABS: 2.9 10*3/uL (ref 0.7–4.0)
LYMPHS PCT: 21 %
MCH: 30 pg (ref 26.0–34.0)
MCHC: 34.1 g/dL (ref 30.0–36.0)
MCV: 88 fL (ref 78.0–100.0)
MONO ABS: 0.5 10*3/uL (ref 0.1–1.0)
Monocytes Relative: 4 %
NEUTROS ABS: 9.7 10*3/uL — AB (ref 1.7–7.7)
Neutrophils Relative %: 71 %
PLATELETS: 283 10*3/uL (ref 150–400)
RBC: 5.4 MIL/uL (ref 4.22–5.81)
RDW: 13.5 % (ref 11.5–15.5)
WBC: 13.6 10*3/uL — ABNORMAL HIGH (ref 4.0–10.5)

## 2015-02-14 LAB — COMPREHENSIVE METABOLIC PANEL
ALBUMIN: 4.2 g/dL (ref 3.5–5.0)
ALT: 60 U/L (ref 17–63)
ANION GAP: 10 (ref 5–15)
AST: 38 U/L (ref 15–41)
Alkaline Phosphatase: 90 U/L (ref 38–126)
BUN: 10 mg/dL (ref 6–20)
CALCIUM: 10 mg/dL (ref 8.9–10.3)
CO2: 31 mmol/L (ref 22–32)
CREATININE: 1.58 mg/dL — AB (ref 0.61–1.24)
Chloride: 97 mmol/L — ABNORMAL LOW (ref 101–111)
GFR calc Af Amer: >60 mL/min (ref >60)
GFR calc non Af Amer: 56 mL/min — ABNORMAL LOW (ref >60)
Glucose, Bld: 100 mg/dL — ABNORMAL HIGH (ref 65–99)
POTASSIUM: 4.1 mmol/L (ref 3.5–5.1)
SODIUM: 138 mmol/L (ref 135–145)
TOTAL PROTEIN: 8.6 g/dL — AB (ref 6.5–8.1)
Total Bilirubin: 0.7 mg/dL (ref 0.3–1.2)

## 2015-02-14 LAB — I-STAT CG4 LACTIC ACID, ED: LACTIC ACID, VENOUS: 1.47 mmol/L (ref 0.5–2.0)

## 2015-02-14 MED ORDER — ONDANSETRON HCL 4 MG/2ML IJ SOLN
4.0000 mg | Freq: Once | INTRAMUSCULAR | Status: DC
  Filled 2015-02-14: qty 2

## 2015-02-14 MED ORDER — MORPHINE SULFATE (PF) 4 MG/ML IV SOLN
4.0000 mg | Freq: Once | INTRAVENOUS | Status: DC
  Filled 2015-02-14: qty 1

## 2015-02-14 MED ORDER — SODIUM CHLORIDE 0.9 % IV BOLUS (SEPSIS): 1000.0000 mL | Freq: Once | INTRAVENOUS | Status: DC

## 2015-02-14 NOTE — ED Notes (Signed)
Pt observed leaving room with family member by Special educational needs teacherN and secretary. This RN followed, observed patient outside and family sitting inside. Pt and family left property at this time. Primary RN notified.

## 2015-02-14 NOTE — ED Provider Notes (Signed)
CSN: [id number]     Arrival date & time 02/14/15  0212 History  By signing my name below, I, Gonzella LexKimberly Bianca Gray, attest that this documentation has been prepared under the direction and in the presence of Shon Batonourtney F Brytnee Bechler, MD. Electronically Signed: Gonzella LexKimberly Bianca Gray, Scribe. 02/14/2015. 3:46 AM.    Chief Complaint  Patient presents with  . Inguinal Hernia     The history is provided by the patient. No language interpreter was used.    HPI Comments: Robert Jennings is a 34 y.o. male who presents to the Emergency Department complaining of an right leg inguinal hernia onset 2 weeks ago with constant, mild 9/10 pain. The pt also reports associated nausea and vomiting as well as associated sharp pain radiating into his right leg. Pt reports the pain has begun to affect his ambulation as well. He reports he has been able to have bowel movements. Pt denies taking any medication for the pain and denies taking any daily medications. Pt has NKDA. He has no other complaints at this time.    Past Medical History  Diagnosis Date  . Urticaria   . Inguinal hernia    History reviewed. No pertinent past surgical history. No family history on file. Social History  Substance Use Topics  . Smoking status: Current Every Day Smoker  . Smokeless tobacco: None  . Alcohol Use: Yes    Review of Systems  Constitutional: Negative for fever.  Respiratory: Negative for shortness of breath.   Cardiovascular: Negative for chest pain.  Gastrointestinal: Positive for nausea, vomiting and abdominal pain. Negative for constipation.  Musculoskeletal: Positive for myalgias ( right leg pain).  All other systems reviewed and are negative.     Allergies  Acetaminophen  Home Medications   Prior to Admission medications   Medication Sig Start Date End Date Taking? Authorizing Provider  doxycycline (VIBRAMYCIN) 100 MG capsule Take 1 capsule (100 mg total) by mouth 2 (two) times daily. 09/15/14   Mirian MoMatthew  Gentry, MD  HYDROcodone-ibuprofen (VICOPROFEN) 7.5-200 MG per tablet Take 1 tablet by mouth every 6 (six) hours as needed for moderate pain. 10/23/14   Elson AreasLeslie K Sofia, PA-C  oxyCODONE-acetaminophen (PERCOCET/ROXICET) 5-325 MG per tablet Take 1-2 tablets by mouth every 6 (six) hours as needed for pain. Patient not taking: Reported on 09/15/2014 07/05/12   Susy Frizzleharles Sheldon, MD   BP 143/87 mmHg  Pulse 104  Temp(Src) 98.2 F (36.8 C) (Oral)  Resp 20  Ht 6\' 2"  (1.88 m)  Wt 185 lb 3 oz (84 kg)  BMI 23.77 kg/m2  SpO2 98% Physical Exam  Constitutional: He is oriented to person, place, and time. He appears well-developed and well-nourished. No distress.  HENT:  Head: Normocephalic and atraumatic.  Cardiovascular: Normal rate, regular rhythm and normal heart sounds.   No murmur heard. Pulmonary/Chest: Effort normal and breath sounds normal. No respiratory distress. He has no wheezes.  Abdominal: Soft. Bowel sounds are normal. He exhibits mass. There is no tenderness. There is no rebound.  Reducible bulge in the inguinal region, minimal tenderness  Genitourinary:  Chaperone (scribe) was present for exam which was performed with no discomfort or complications.  No scrotal swelling noted   Musculoskeletal: He exhibits no edema.  Neurological: He is alert and oriented to person, place, and time.  Skin: Skin is warm and dry.  Psychiatric: He has a normal mood and affect.  Nursing note and vitals reviewed.   ED Course  Procedures  DIAGNOSTIC STUDIES:    Oxygen  Saturation is 98% on RA, normal by my interpretation.   COORDINATION OF CARE:  6:36 AM  Discussed treatment plan with pt at bedside and pt agreed to plan.     Labs Review Labs Reviewed  CBC WITH DIFFERENTIAL/PLATELET - Abnormal; Notable for the following:    WBC 13.6 (*)    Neutro Abs 9.7 (*)    All other components within normal limits  COMPREHENSIVE METABOLIC PANEL - Abnormal; Notable for the following:    Chloride 97 (*)     Glucose, Bld 100 (*)    Creatinine, Ser 1.58 (*)    Total Protein 8.6 (*)    GFR calc non Af Amer 56 (*)    All other components within normal limits  I-STAT CG4 LACTIC ACID, ED  I-STAT CG4 LACTIC ACID, ED    Imaging Review No results found. I have personally reviewed and evaluated these images and lab results as part of my medical decision-making.   EKG Interpretation None      MDM   Final diagnoses:  Unilateral recurrent inguinal hernia without obstruction or gangrene    Patient presents with abdominal pain. Reports history of inguinal hernia. Also reports vomiting. Nontoxic on exam. Does have what appears to be a reducible hernia in the right inguinal region. Lab work and imaging ordered. Pain and nausea medication ordered. Unfortunately, patient left facility prior to formal evaluation and treatment.  I personally performed the services described in this documentation, which was scribed in my presence. The recorded information has been reviewed and is accurate.    Shon Baton, MD 02/14/15 [phone]

## 2015-02-14 NOTE — ED Notes (Signed)
Pt was arguing w/ his visitor.  RN went to get supplies to start IV when she returned she found that pt and his visitors had left the room.

## 2015-02-14 NOTE — ED Notes (Signed)
C/o R inguinal hernia pain x 2 months.

## 2015-07-19 ENCOUNTER — Encounter (HOSPITAL_BASED_OUTPATIENT_CLINIC_OR_DEPARTMENT_OTHER): Payer: Self-pay | Admitting: *Deleted

## 2015-07-19 ENCOUNTER — Emergency Department (HOSPITAL_BASED_OUTPATIENT_CLINIC_OR_DEPARTMENT_OTHER)
Admission: EM | Admit: 2015-07-19 | Discharge: 2015-07-19 | Disposition: A | Discharge status: Home / Self Care | Payer: Self-pay | Attending: Emergency Medicine | Admitting: Emergency Medicine

## 2015-07-19 DIAGNOSIS — Z872 Personal history of diseases of the skin and subcutaneous tissue: Secondary | ICD-10-CM | POA: Insufficient documentation

## 2015-07-19 DIAGNOSIS — F17210 Nicotine dependence, cigarettes, uncomplicated: Secondary | ICD-10-CM | POA: Insufficient documentation

## 2015-07-19 DIAGNOSIS — K4090 Unilateral inguinal hernia, without obstruction or gangrene, not specified as recurrent: Secondary | ICD-10-CM | POA: Insufficient documentation

## 2015-07-19 LAB — CBC WITH DIFFERENTIAL/PLATELET
BASOS PCT: 0 %
Basophils Absolute: 0 10*3/uL (ref 0.0–0.1)
EOS ABS: 0.3 10*3/uL (ref 0.0–0.7)
Eosinophils Relative: 3 %
HEMATOCRIT: 46.8 % (ref 39.0–52.0)
Hemoglobin: 16.2 g/dL (ref 13.0–17.0)
LYMPHS PCT: 12 %
Lymphs Abs: 1.1 10*3/uL (ref 0.7–4.0)
MCH: 29.8 pg (ref 26.0–34.0)
MCHC: 34.6 g/dL (ref 30.0–36.0)
MCV: 86.2 fL (ref 78.0–100.0)
MONO ABS: 0.9 10*3/uL (ref 0.1–1.0)
Monocytes Relative: 10 %
NEUTROS PCT: 75 %
Neutro Abs: 6.5 10*3/uL (ref 1.7–7.7)
PLATELETS: 205 10*3/uL (ref 150–400)
RBC: 5.43 MIL/uL (ref 4.22–5.81)
RDW: 13.5 % (ref 11.5–15.5)
WBC: 8.8 10*3/uL (ref 4.0–10.5)

## 2015-07-19 LAB — URINALYSIS, ROUTINE W REFLEX MICROSCOPIC
Glucose, UA: NEGATIVE mg/dL
Hgb urine dipstick: NEGATIVE
KETONES UR: 15 mg/dL — AB
NITRITE: NEGATIVE
PH: 6 (ref 5.0–8.0)
Protein, ur: 30 mg/dL — AB
SPECIFIC GRAVITY, URINE: 1.028 (ref 1.005–1.030)

## 2015-07-19 LAB — BASIC METABOLIC PANEL
ANION GAP: 11 (ref 5–15)
BUN: 13 mg/dL (ref 6–20)
CO2: 21 mmol/L — AB (ref 22–32)
CREATININE: 1.68 mg/dL — AB (ref 0.61–1.24)
Calcium: 9.2 mg/dL (ref 8.9–10.3)
Chloride: 106 mmol/L (ref 101–111)
GFR calc non Af Amer: 51 mL/min — ABNORMAL LOW (ref >60)
GFR, EST AFRICAN AMERICAN: 59 mL/min — AB (ref >60)
Glucose, Bld: 108 mg/dL — ABNORMAL HIGH (ref 65–99)
Potassium: 3.7 mmol/L (ref 3.5–5.1)
SODIUM: 138 mmol/L (ref 135–145)

## 2015-07-19 LAB — I-STAT CG4 LACTIC ACID, ED: LACTIC ACID, VENOUS: 1.12 mmol/L (ref 0.5–2.0)

## 2015-07-19 LAB — URINE MICROSCOPIC-ADD ON

## 2015-07-19 MED ORDER — ONDANSETRON HCL 4 MG/2ML IJ SOLN
4.0000 mg | Freq: Once | INTRAMUSCULAR | Status: AC
  Administered 2015-07-19: 4 mg via INTRAVENOUS
  Filled 2015-07-19: qty 2

## 2015-07-19 MED ORDER — FENTANYL CITRATE (PF) 100 MCG/2ML IJ SOLN
50.0000 ug | Freq: Once | INTRAMUSCULAR | Status: AC
  Administered 2015-07-19: 50 ug via INTRAVENOUS
  Filled 2015-07-19: qty 2

## 2015-07-19 MED ORDER — HYDROCODONE-IBUPROFEN 7.5-200 MG PO TABS: 1.0000 | ORAL_TABLET | Freq: Four times a day (QID) | ORAL | Status: DC | PRN

## 2015-07-19 MED ORDER — FENTANYL CITRATE (PF) 100 MCG/2ML IJ SOLN
100.0000 ug | Freq: Once | INTRAMUSCULAR | Status: AC
  Administered 2015-07-19: 100 ug via INTRAVENOUS
  Filled 2015-07-19: qty 2

## 2015-07-19 NOTE — ED Notes (Signed)
MD at bedside. 

## 2015-07-19 NOTE — Discharge Instructions (Signed)
Inguinal Hernia, Adult , Care After °Refer to this sheet in the next few weeks. These discharge instructions provide you with general information on caring for yourself after you leave the hospital. Your caregiver may also give you specific instructions. Your treatment has been planned according to the most current medical practices available, but unavoidable complications sometimes occur. If you have any problems or questions after discharge, please call your caregiver. °HOME CARE INSTRUCTIONS °· Put ice on the operative site. °¨ Put ice in a plastic bag. °¨ Place a towel between your skin and the bag. °¨ Leave the ice on for 15-20 minutes at a time, 03-04 times a day while awake. °· Change bandages (dressings) as directed. °· Keep the wound dry and clean. The wound may be washed gently with soap and water. Gently blot or dab the wound dry. It is okay to take showers 24 to 48 hours after surgery. Do not take baths, use swimming pools, or use hot tubs for 10 days, or as directed by your caregiver. °· Only take over-the-counter or prescription medicines for pain, discomfort, or fever as directed by your caregiver. °· Continue your normal diet as directed. °· Do not lift anything more than 10 pounds or play contact sports for 3 weeks, or as directed. °SEEK MEDICAL CARE IF: °· There is redness, swelling, or increasing pain in the wound. °· There is fluid (pus) coming from the wound. °· There is drainage from a wound lasting longer than 1 day. °· You have an oral temperature above 102° F (38.9° C). °· You notice a bad smell coming from the wound or dressing. °· The wound breaks open after the stitches (sutures) have been removed. °· You notice increasing pain in the shoulders (shoulder strap areas). °· You develop dizzy episodes or fainting while standing. °· You feel sick to your stomach (nauseous) or throw up (vomit). °SEEK IMMEDIATE MEDICAL CARE IF: °· You develop a rash. °· You have difficulty breathing. °· You  develop a reaction or have side effects to medicines you were given. °MAKE SURE YOU:  °· Understand these instructions. °· Will watch your condition. °· Will get help right away if you are not doing well or get worse. °  °This information is not intended to replace advice given to you by your health care provider. Make sure you discuss any questions you have with your health care provider. °  °Document Released: 04/14/2006 Document Revised: 04/04/2014 Document Reviewed: 09/15/2014 °Elsevier Interactive Patient Education ©2016 [employer] ° °

## 2015-07-19 NOTE — ED Provider Notes (Signed)
CSN: [id number]     Arrival date & time 07/19/15  1911 History  By signing my name below, I, Budd PalmerVanessa Prueter, attest that this documentation has been prepared under the direction and in the presence of Nelva Nayobert Timiyah Romito, MD. Electronically Signed: Budd PalmerVanessa Prueter, ED Scribe. 07/19/2015. 7:43 PM.      Chief Complaint  Patient presents with  . Inguinal Hernia   The history is provided by the patient. No language interpreter was used.   HPI Comments: Robert Jennings is a 35 y.o. male smoker at 0.5 ppd with a PMHx of inguinal hernia and urticaria who presents to the Emergency Department complaining of an increasingly painful inguinal hernia onset 2 years ago, worsneing significantly within the past few days. Pt states the hernia has been out for the past year. He states he went to the ED 6 months ago, where the hernia was reduced with a large amount of pain. He states that when he stood up, the hernia came right back out. He notes that since then he has not returned to the ED since then, due to the painful procedure. He notes he has tried to keep the hernia in through the use of a truss, but states that as soon as he takes it off, the hernia will come back out. He reports associated nausea with every painful episode, as well as vomiting for the past 3 days. He notes he has been taking phenergan for nausea with mild relief. He states he has also been taking stool softeners.  Pt is allergic to acetaminophen.   Past Medical History  Diagnosis Date  . Urticaria   . Inguinal hernia    History reviewed. No pertinent past surgical history. History reviewed. No pertinent family history. Social History  Substance Use Topics  . Smoking status: Current Every Day Smoker -- 0.50 packs/day    Types: Cigarettes  . Smokeless tobacco: None  . Alcohol Use: Yes    Review of Systems  Gastrointestinal: Positive for nausea, vomiting and abdominal pain.  All other systems reviewed and are negative.   Allergies   Acetaminophen  Home Medications   Prior to Admission medications   Medication Sig Start Date End Date Taking? Authorizing Provider  HYDROcodone-ibuprofen (VICOPROFEN) 7.5-200 MG tablet Take 1 tablet by mouth every 6 (six) hours as needed for moderate pain. 07/19/15   Nelva Nayobert Milanna Kozlov, MD   BP 134/103 mmHg  Pulse 89  Temp(Src) 99 F (37.2 C) (Oral)  Resp 18  Ht 6\' 1"  (1.854 m)  Wt 200 lb (90.719 kg)  BMI 26.39 kg/m2  SpO2 100% Physical Exam  Constitutional: He is oriented to person, place, and time. He appears well-developed and well-nourished. No distress.  HENT:  Head: Normocephalic and atraumatic.  Eyes: Pupils are equal, round, and reactive to light.  Neck: Normal range of motion.  Cardiovascular: Normal rate and intact distal pulses.   Pulmonary/Chest: No respiratory distress.  Abdominal: Normal appearance. He exhibits no distension. A hernia is present. Hernia confirmed positive in the right inguinal area.  Musculoskeletal: Normal range of motion.  Neurological: He is alert and oriented to person, place, and time. No cranial nerve deficit.  Skin: Skin is warm and dry. No rash noted.  Psychiatric: He has a normal mood and affect. His behavior is normal.  Nursing note and vitals reviewed.   ED Course  Hernia reduction Date/Time: 07/19/2015 8:51 PM Performed by: Nelva NayBEATON, Trase Bunda Authorized by: Nelva NayBEATON, Natiya Seelinger Consent: Verbal consent obtained. Written consent not obtained. Risks and benefits: risks,  benefits and alternatives were discussed Consent given by: patient Patient identity confirmed: verbally with patient Local anesthesia used: no Patient sedated: yes Analgesia: fentanyl Vitals: Vital signs were monitored during sedation. Patient tolerance: Patient tolerated the procedure well with no immediate complications    DIAGNOSTIC STUDIES: Oxygen Saturation is 100% on RA, normal by my interpretation.    COORDINATION OF CARE: 7:40 PM - Discussed plans to order  diagnostic studies and a dose of fentanyl. Advised pt that he will need surgery to permanently fix the hernia. Pt advised of plan for treatment and pt agrees.  Labs Review Labs Reviewed  URINALYSIS, ROUTINE W REFLEX MICROSCOPIC (NOT AT [facility]) - Abnormal; Notable for the following:    Color, Urine AMBER (*)    Bilirubin Urine SMALL (*)    Ketones, ur 15 (*)    Protein, ur 30 (*)    Leukocytes, UA TRACE (*)    All other components within normal limits  BASIC METABOLIC PANEL - Abnormal; Notable for the following:    CO2 21 (*)    Glucose, Bld 108 (*)    Creatinine, Ser 1.68 (*)    GFR calc non Af Amer 51 (*)    GFR calc Af Amer 59 (*)    All other components within normal limits  URINE MICROSCOPIC-ADD ON - Abnormal; Notable for the following:    Squamous Epithelial / LPF 0-5 (*)    Bacteria, UA RARE (*)    All other components within normal limits  CBC WITH DIFFERENTIAL/PLATELET  LACTIC ACID, PLASMA  I-STAT CG4 LACTIC ACID, ED    Imaging Review No results found. I have personally reviewed and evaluated these images and lab results as part of my medical decision-making.  After treatment in the ED the patient feels back to baseline and wants to go home.  MDM   Final diagnoses:  Right inguinal hernia      Nelva Nay, MD 07/19/15 2052

## 2015-07-19 NOTE — ED Notes (Signed)
Pt given d/c instructions as per chart. Verbalizes understanding. No questions. Rx x 1 with narc prec instructions

## 2015-07-19 NOTE — ED Notes (Signed)
Right hernia that has been painful x several days.  Pt unable to reduce hernia this time.  Also reports hematuria.

## 2015-08-20 ENCOUNTER — Emergency Department (HOSPITAL_COMMUNITY): Payer: Self-pay

## 2015-08-20 ENCOUNTER — Encounter (HOSPITAL_COMMUNITY): Payer: Self-pay | Admitting: Emergency Medicine

## 2015-08-20 ENCOUNTER — Emergency Department (HOSPITAL_COMMUNITY)
Admission: EM | Admit: 2015-08-20 | Discharge: 2015-08-21 | Discharge status: Against Medical Advice | Payer: Self-pay | Attending: Emergency Medicine | Admitting: Emergency Medicine

## 2015-08-20 DIAGNOSIS — Z872 Personal history of diseases of the skin and subcutaneous tissue: Secondary | ICD-10-CM | POA: Insufficient documentation

## 2015-08-20 DIAGNOSIS — K4090 Unilateral inguinal hernia, without obstruction or gangrene, not specified as recurrent: Secondary | ICD-10-CM | POA: Insufficient documentation

## 2015-08-20 DIAGNOSIS — R110 Nausea: Secondary | ICD-10-CM | POA: Insufficient documentation

## 2015-08-20 DIAGNOSIS — F17210 Nicotine dependence, cigarettes, uncomplicated: Secondary | ICD-10-CM | POA: Insufficient documentation

## 2015-08-20 LAB — CBC WITH DIFFERENTIAL/PLATELET
Basophils Absolute: 0 10*3/uL (ref 0.0–0.1)
Basophils Relative: 0 %
Eosinophils Absolute: 0.3 10*3/uL (ref 0.0–0.7)
Eosinophils Relative: 4 %
HEMATOCRIT: 44.5 % (ref 39.0–52.0)
HEMOGLOBIN: 15.2 g/dL (ref 13.0–17.0)
LYMPHS ABS: 2.6 10*3/uL (ref 0.7–4.0)
Lymphocytes Relative: 32 %
MCH: 28.8 pg (ref 26.0–34.0)
MCHC: 34.2 g/dL (ref 30.0–36.0)
MCV: 84.3 fL (ref 78.0–100.0)
MONO ABS: 0.5 10*3/uL (ref 0.1–1.0)
MONOS PCT: 7 %
NEUTROS ABS: 4.7 10*3/uL (ref 1.7–7.7)
NEUTROS PCT: 57 %
Platelets: 234 10*3/uL (ref 150–400)
RBC: 5.28 MIL/uL (ref 4.22–5.81)
RDW: 12.8 % (ref 11.5–15.5)
WBC: 8.1 10*3/uL (ref 4.0–10.5)

## 2015-08-20 LAB — URINE MICROSCOPIC-ADD ON: RBC / HPF: NONE SEEN RBC/hpf (ref 0–5)

## 2015-08-20 LAB — I-STAT CHEM 8, ED
BUN: 13 mg/dL (ref 6–20)
CALCIUM ION: 1.14 mmol/L (ref 1.12–1.23)
CREATININE: 1.4 mg/dL — AB (ref 0.61–1.24)
Chloride: 103 mmol/L (ref 101–111)
GLUCOSE: 83 mg/dL (ref 65–99)
HCT: 47 % (ref 39.0–52.0)
Hemoglobin: 16 g/dL (ref 13.0–17.0)
Potassium: 4.4 mmol/L (ref 3.5–5.1)
Sodium: 139 mmol/L (ref 135–145)
TCO2: 25 mmol/L (ref 0–100)

## 2015-08-20 LAB — URINALYSIS, ROUTINE W REFLEX MICROSCOPIC
Bilirubin Urine: NEGATIVE
GLUCOSE, UA: NEGATIVE mg/dL
HGB URINE DIPSTICK: NEGATIVE
KETONES UR: NEGATIVE mg/dL
Nitrite: NEGATIVE
PROTEIN: NEGATIVE mg/dL
Specific Gravity, Urine: 1.024 (ref 1.005–1.030)
pH: 5.5 (ref 5.0–8.0)

## 2015-08-20 MED ORDER — MORPHINE SULFATE (PF) 4 MG/ML IV SOLN
4.0000 mg | Freq: Once | INTRAVENOUS | Status: AC
  Administered 2015-08-20: 4 mg via INTRAVENOUS
  Filled 2015-08-20: qty 1

## 2015-08-20 MED ORDER — ONDANSETRON HCL 4 MG/2ML IJ SOLN
4.0000 mg | Freq: Once | INTRAMUSCULAR | Status: AC
  Administered 2015-08-20: 4 mg via INTRAVENOUS
  Filled 2015-08-20: qty 2

## 2015-08-20 MED ORDER — SODIUM CHLORIDE 0.9 % IV SOLN
Freq: Once | INTRAVENOUS | Status: AC
  Administered 2015-08-20: 21:00:00 via INTRAVENOUS

## 2015-08-20 MED ORDER — IOPAMIDOL (ISOVUE-300) INJECTION 61%
INTRAVENOUS | Status: AC
  Administered 2015-08-20: 100 mL
  Filled 2015-08-20: qty 100

## 2015-08-20 NOTE — ED Provider Notes (Signed)
CSN: [id number]     Arrival date & time 08/20/15  1953 History   First MD Initiated Contact with Patient 08/20/15 2044     Chief Complaint  Patient presents with  . Inguinal Hernia     (Consider location/radiation/quality/duration/timing/severity/associated sxs/prior Treatment) HPI Comments: Is a 35 year old male with a known history of right inguinal hernia who spin having discomfort for the past 2 days.  He has no medical insurance, so has not been able to have surgical repair.  He has been using a truss which he states he can't find at this point, so has not worn it in the past several months.  He states the pain is different now radiating up into his abdomen.  He states he must walk around with his hand on his abdomen.  Holding his hernia "down".  For the last several days.  He reports that he's had some nausea without vomiting.  No change in bowel or bladder control or habits.  Denies any fever or testicular pain  The history is provided by the patient and the spouse.    Past Medical History  Diagnosis Date  . Urticaria   . Inguinal hernia    History reviewed. No pertinent past surgical history. No family history on file. Social History  Substance Use Topics  . Smoking status: Current Every Day Smoker -- 0.50 packs/day    Types: Cigarettes  . Smokeless tobacco: None  . Alcohol Use: Yes    Review of Systems  Constitutional: Negative for fever and chills.  Gastrointestinal: Positive for nausea and abdominal pain. Negative for vomiting, diarrhea and constipation.  Genitourinary: Negative for dysuria and frequency.  All other systems reviewed and are negative.     Allergies  Review of patient's allergies indicates no known allergies.  Home Medications   Prior to Admission medications   Medication Sig Start Date End Date Taking? Authorizing Provider  ibuprofen (ADVIL,MOTRIN) 200 MG tablet Take 200 mg by mouth every 6 (six) hours as needed for moderate pain.   Yes  Historical Provider, MD  HYDROcodone-ibuprofen (VICOPROFEN) 7.5-200 MG tablet Take 1 tablet by mouth every 6 (six) hours as needed for moderate pain. Patient not taking: Reported on 08/20/2015 07/19/15   Nelva Nayobert Beaton, MD   BP 130/69 mmHg  Pulse 69  Temp(Src) 98.1 F (36.7 C) (Oral)  Resp 18  Ht 6\' 2"  (1.88 m)  Wt 90.719 kg  BMI 25.67 kg/m2  SpO2 94% Physical Exam  Constitutional: He appears well-developed and well-nourished.  HENT:  Head: Normocephalic.  Eyes: Pupils are equal, round, and reactive to light.  Neck: Normal range of motion.  Cardiovascular: Normal rate and regular rhythm.   Pulmonary/Chest: Effort normal.  Abdominal: Soft. He exhibits no distension. There is tenderness in the right lower quadrant. There is no rigidity, no rebound, no guarding and negative Murphy's sign. A hernia is present. Hernia confirmed positive in the right inguinal area.    Musculoskeletal: Normal range of motion.  Neurological: He is alert.    ED Course  Procedures (including critical care time) Labs Review Labs Reviewed  URINALYSIS, ROUTINE W REFLEX MICROSCOPIC (NOT AT [facility]) - Abnormal; Notable for the following:    Leukocytes, UA SMALL (*)    All other components within normal limits  URINE MICROSCOPIC-ADD ON - Abnormal; Notable for the following:    Squamous Epithelial / LPF 0-5 (*)    Bacteria, UA RARE (*)    All other components within normal limits  I-STAT CHEM 8, ED -  Abnormal; Notable for the following:    Creatinine, Ser 1.40 (*)    All other components within normal limits  CBC WITH DIFFERENTIAL/PLATELET    Imaging Review No results found. I have personally reviewed and evaluated these images and lab results as part of my medical decision-making.   EKG Interpretation None     Patient left before he could be given the results of CT Scan  MDM   Final diagnoses:  Right inguinal hernia         Earley Favor, NP 08/21/15 1610  Mancel Bale, MD 08/25/15  [phone]

## 2015-08-20 NOTE — ED Notes (Signed)
Pt st's he has a inguinal hernia on the right.  St's he has had it for a long time but does not have insurance so he can't have surgery.  Pt c/o pain with walking,

## 2015-08-20 NOTE — ED Notes (Signed)
Pt changed mind and decided to continue treatment.

## 2015-08-20 NOTE — ED Notes (Signed)
Pt says an emergency came up at home and he needs to leave AMA.

## 2015-08-20 NOTE — ED Notes (Signed)
Patient transported to CT 

## 2015-08-21 NOTE — ED Notes (Signed)
Pt states that he has to leave and wants to sign out AMA.

## 2015-08-26 ENCOUNTER — Encounter (HOSPITAL_COMMUNITY): Payer: Self-pay | Admitting: Emergency Medicine

## 2015-08-26 DIAGNOSIS — Z872 Personal history of diseases of the skin and subcutaneous tissue: Secondary | ICD-10-CM | POA: Insufficient documentation

## 2015-08-26 DIAGNOSIS — F17210 Nicotine dependence, cigarettes, uncomplicated: Secondary | ICD-10-CM | POA: Insufficient documentation

## 2015-08-26 DIAGNOSIS — K4090 Unilateral inguinal hernia, without obstruction or gangrene, not specified as recurrent: Secondary | ICD-10-CM | POA: Insufficient documentation

## 2015-08-26 NOTE — ED Notes (Signed)
Pt states he was seen in ED about a week ago for R inguinal hernia but had to sign-out AMA.  C/o increased pain and size of hernia.

## 2015-08-27 ENCOUNTER — Emergency Department (HOSPITAL_COMMUNITY)
Admission: EM | Admit: 2015-08-27 | Discharge: 2015-08-27 | Disposition: A | Discharge status: Home / Self Care | Payer: Self-pay | Attending: Emergency Medicine | Admitting: Emergency Medicine

## 2015-08-27 DIAGNOSIS — K4090 Unilateral inguinal hernia, without obstruction or gangrene, not specified as recurrent: Secondary | ICD-10-CM

## 2015-08-27 MED ORDER — IBUPROFEN 600 MG PO TABS: 600.0000 mg | ORAL_TABLET | Freq: Three times a day (TID) | ORAL | Status: DC | PRN

## 2015-08-27 NOTE — ED Provider Notes (Signed)
CSN: [id number]     Arrival date & time 08/26/15  2322 History  By signing my name below, I, Bethel Born, attest that this documentation has been prepared under the direction and in the presence of Azalia Bilis, MD. Electronically Signed: Bethel Born, ED Scribe. 08/27/2015. 12:40 AM   Chief Complaint  Patient presents with  . Inguinal Hernia   The history is provided by the patient. No language interpreter was used.   Robert Jennings is a 35 y.o. male who presents to the Emergency Department complaining of increasing pain and swelling at the site of a right inguinal hernia with onset 6 months ago. The hernia is reducible but he states that it comes back out with walking, laughing, and coughing. He was seen in the ED 1 week ago for similar symptoms where he had a CT scan. Pt states that the hernia has been present for 2 years but he has not been able to find a surgeon who would work with him given his lack of insurance. Associated symptoms include right sided abdominal pain.  Past Medical History  Diagnosis Date  . Urticaria   . Inguinal hernia    History reviewed. No pertinent past surgical history. No family history on file. Social History  Substance Use Topics  . Smoking status: Current Every Day Smoker -- 0.50 packs/day    Types: Cigarettes  . Smokeless tobacco: None  . Alcohol Use: Yes    Review of Systems  10 Systems reviewed and all are negative for acute change except as noted in the HPI.  Allergies  Review of patient's allergies indicates no known allergies.  Home Medications   Prior to Admission medications   Medication Sig Start Date End Date Taking? Authorizing Provider  HYDROcodone-ibuprofen (VICOPROFEN) 7.5-200 MG tablet Take 1 tablet by mouth every 6 (six) hours as needed for moderate pain. Patient not taking: Reported on 08/20/2015 07/19/15   Nelva Nay, MD  ibuprofen (ADVIL,MOTRIN) 200 MG tablet Take 200 mg by mouth every 6 (six) hours as needed for  moderate pain.    Historical Provider, MD   BP 106/79 mmHg  Pulse 98  Temp(Src) 98.1 F (36.7 C) (Oral)  Resp 16  Ht  (1.88 m)  Wt 200 lb (90.719 kg)  BMI 25.67 kg/m2  SpO2 99% Physical Exam  Constitutional: He is oriented to person, place, and time. He appears well-developed and well-nourished.  HENT:  Head: Normocephalic and atraumatic.  Eyes: EOM are normal.  Neck: Normal range of motion.  Cardiovascular: Normal rate, regular rhythm, normal heart sounds and intact distal pulses.   Pulmonary/Chest: Effort normal and breath sounds normal. No respiratory distress.  Abdominal: Soft. He exhibits no distension. There is no tenderness.  Genitourinary:  Tenderness and fullness of right inguinal canal, easily reducible   Musculoskeletal: Normal range of motion.  Neurological: He is alert and oriented to person, place, and time.  Skin: Skin is warm and dry.  Psychiatric: He has a normal mood and affect. Judgment normal.  Nursing note and vitals reviewed.   ED Course  Procedures (including critical care time) DIAGNOSTIC STUDIES: Oxygen Saturation is 99% on RA,  normal by my interpretation.    COORDINATION OF CARE: 12:36 AM Discussed treatment plan which includes discharge to f/u with a surgeon with pt at bedside and pt agreed to plan.  Labs Review Labs Reviewed - No data to display  Imaging Review No results found.   EKG Interpretation None      MDM  Final diagnoses:  None    Easily reducible right inguinal hernia. Patient will need outpatient general surgery follow-up for an outpatient surgical repair. Vital signs normal. No vomiting.  CT scan obtained on 08/20/2015 demonstrated fat-containing right inguinal hernia.   I personally performed the services described in this documentation, which was scribed in my presence. The recorded information has been reviewed and is accurate.       Azalia BilisKevin Bennet Kujawa, MD 08/27/15 [phone]

## 2015-08-27 NOTE — Discharge Instructions (Signed)

## 2015-09-01 ENCOUNTER — Encounter (HOSPITAL_COMMUNITY): Payer: Self-pay

## 2015-09-01 ENCOUNTER — Emergency Department (HOSPITAL_COMMUNITY)
Admission: EM | Admit: 2015-09-01 | Discharge: 2015-09-01 | Disposition: A | Discharge status: Home / Self Care | Payer: Self-pay | Attending: Emergency Medicine | Admitting: Emergency Medicine

## 2015-09-01 DIAGNOSIS — F17210 Nicotine dependence, cigarettes, uncomplicated: Secondary | ICD-10-CM | POA: Insufficient documentation

## 2015-09-01 DIAGNOSIS — K4090 Unilateral inguinal hernia, without obstruction or gangrene, not specified as recurrent: Secondary | ICD-10-CM | POA: Insufficient documentation

## 2015-09-01 NOTE — ED Notes (Signed)
Pt seen 5 days ago for r inguinal hernia. Needs work note with lifting restrictions.

## 2015-09-01 NOTE — ED Provider Notes (Signed)
CSN: [id number]     Arrival date & time 09/01/15  0010 History   First MD Initiated Contact with Patient 09/01/15 0014     Chief Complaint  Patient presents with  . Follow-up     (Consider location/radiation/quality/duration/timing/severity/associated sxs/prior Treatment) HPI   Thomson Herbers is a 35 y.o. male with PMH significant for right inguinal hernia who presents with ongoing right hernia.  He has been seen in the ED twice in the last 12 days for the same.  He has had it for 2 years, but over the last 6 months seems to have become more swollen.  He is able to reduce it easily.  He reports he does a lot of heavy lifting at work, and is constantly having to reduce it.  Normal PO intake. No fever, chills, N/V/D/C, or abdominal pain.  He had a CT scan done 08/20/15.   Past Medical History  Diagnosis Date  . Urticaria   . Inguinal hernia    History reviewed. No pertinent past surgical history. History reviewed. No pertinent family history. Social History  Substance Use Topics  . Smoking status: Current Every Day Smoker -- 0.50 packs/day    Types: Cigarettes  . Smokeless tobacco: None  . Alcohol Use: Yes    Review of Systems All other systems negative unless otherwise stated in HPI    Allergies  Review of patient's allergies indicates no known allergies.  Home Medications   Prior to Admission medications   Medication Sig Start Date End Date Taking? Authorizing Provider  ibuprofen (ADVIL,MOTRIN) 600 MG tablet Take 1 tablet (600 mg total) by mouth every 8 (eight) hours as needed. 08/27/15   Azalia Bilis, MD   BP 152/75 mmHg  Pulse 65  Resp 14  SpO2 100% Physical Exam  Constitutional: He is oriented to person, place, and time. He appears well-developed and well-nourished.  Non-toxic appearance. He does not have a sickly appearance. He does not appear ill.  HENT:  Head: Normocephalic and atraumatic.  Mouth/Throat: Oropharynx is clear and moist.  Eyes: Conjunctivae are  normal.  Neck: Normal range of motion. Neck supple.  Cardiovascular: Normal rate and regular rhythm.   Pulmonary/Chest: Effort normal and breath sounds normal. No accessory muscle usage or stridor. No respiratory distress. He has no wheezes. He has no rhonchi. He has no rales.  Abdominal: Soft. Bowel sounds are normal. He exhibits no distension. There is no tenderness.  Genitourinary:  Easily reducible right inguinal hernia. Fullness in right inguinal canal.   Musculoskeletal: Normal range of motion.  Lymphadenopathy:    He has no cervical adenopathy.  Neurological: He is alert and oriented to person, place, and time.  Speech clear without dysarthria.  Skin: Skin is warm and dry.  Psychiatric: He has a normal mood and affect. His behavior is normal.    ED Course  Procedures (including critical care time) Labs Review Labs Reviewed - No data to display  Imaging Review No results found. I have personally reviewed and evaluated these images and lab results as part of my medical decision-making.   EKG Interpretation None      MDM   Final diagnoses:  Right inguinal hernia   Patient presents with chronic right inguinal hernia.  No acute changes.  No abdominal pain.  VSS, NAD.  Easily reducible right inguinal hernia on exam.  No abdominal tenderness. CT scan 08/20/15 showed fat within right inguinal canal w/o bowel involvement or inflammation.  No indication for repeat imaging today.  Advised patient  to follow up with general surgery.  Discussed return precautions.  Patient agrees and acknowledges the above plan for discharge.       Cheri FowlerKayla Adreana Coull, PA-C 09/01/15 [phone]  Tomasita CrumbleAdeleke Oni, MD 09/01/15 [phone]

## 2015-09-01 NOTE — Discharge Instructions (Signed)

## 2015-09-02 ENCOUNTER — Emergency Department (HOSPITAL_COMMUNITY): Payer: Self-pay

## 2015-09-02 ENCOUNTER — Encounter (HOSPITAL_COMMUNITY): Payer: Self-pay | Admitting: Oncology

## 2015-09-02 ENCOUNTER — Emergency Department (HOSPITAL_COMMUNITY)
Admission: EM | Admit: 2015-09-02 | Discharge: 2015-09-03 | Disposition: A | Discharge status: Home / Self Care | Payer: Self-pay | Attending: Emergency Medicine | Admitting: Emergency Medicine

## 2015-09-02 DIAGNOSIS — Z79891 Long term (current) use of opiate analgesic: Secondary | ICD-10-CM | POA: Insufficient documentation

## 2015-09-02 DIAGNOSIS — F17210 Nicotine dependence, cigarettes, uncomplicated: Secondary | ICD-10-CM | POA: Insufficient documentation

## 2015-09-02 DIAGNOSIS — Z791 Long term (current) use of non-steroidal anti-inflammatories (NSAID): Secondary | ICD-10-CM | POA: Insufficient documentation

## 2015-09-02 DIAGNOSIS — K4090 Unilateral inguinal hernia, without obstruction or gangrene, not specified as recurrent: Secondary | ICD-10-CM | POA: Insufficient documentation

## 2015-09-02 LAB — CBC WITH DIFFERENTIAL/PLATELET
BASOS PCT: 0 %
Basophils Absolute: 0 10*3/uL (ref 0.0–0.1)
Eosinophils Absolute: 0.2 10*3/uL (ref 0.0–0.7)
Eosinophils Relative: 3 %
HEMATOCRIT: 44.3 % (ref 39.0–52.0)
Hemoglobin: 15.3 g/dL (ref 13.0–17.0)
Lymphocytes Relative: 29 %
Lymphs Abs: 2.7 10*3/uL (ref 0.7–4.0)
MCH: 29.7 pg (ref 26.0–34.0)
MCHC: 34.5 g/dL (ref 30.0–36.0)
MCV: 86 fL (ref 78.0–100.0)
MONO ABS: 0.7 10*3/uL (ref 0.1–1.0)
MONOS PCT: 8 %
NEUTROS ABS: 5.5 10*3/uL (ref 1.7–7.7)
Neutrophils Relative %: 60 %
Platelets: 311 10*3/uL (ref 150–400)
RBC: 5.15 MIL/uL (ref 4.22–5.81)
RDW: 13.3 % (ref 11.5–15.5)
WBC: 9.1 10*3/uL (ref 4.0–10.5)

## 2015-09-02 LAB — BASIC METABOLIC PANEL
Anion gap: 6 (ref 5–15)
BUN: 9 mg/dL (ref 6–20)
CALCIUM: 9.4 mg/dL (ref 8.9–10.3)
CO2: 29 mmol/L (ref 22–32)
CREATININE: 1.42 mg/dL — AB (ref 0.61–1.24)
Chloride: 105 mmol/L (ref 101–111)
GFR calc non Af Amer: >60 mL/min (ref >60)
GLUCOSE: 75 mg/dL (ref 65–99)
Potassium: 3.8 mmol/L (ref 3.5–5.1)
Sodium: 140 mmol/L (ref 135–145)

## 2015-09-02 LAB — I-STAT CG4 LACTIC ACID, ED: Lactic Acid, Venous: 0.47 mmol/L — ABNORMAL LOW (ref 0.5–2.0)

## 2015-09-02 MED ORDER — HYDROMORPHONE HCL 1 MG/ML IJ SOLN
1.0000 mg | Freq: Once | INTRAMUSCULAR | Status: AC
  Administered 2015-09-02: 1 mg via INTRAVENOUS
  Filled 2015-09-02: qty 1

## 2015-09-02 NOTE — ED Provider Notes (Signed)
CSN: [id number]     Arrival date & time 09/02/15  2159 History  By signing my name below, I, Robert Jennings, attest that this documentation has been prepared under the direction and in the presence of Shon Batonourtney F Horton, MD. Electronically Signed: Randell PatientMarrissa Jennings, ED Scribe. 09/03/2015. 1:01 AM.   Chief Complaint  Patient presents with  . Inguinal Hernia   The history is provided by the patient. No language interpreter was used.   HPI Comments: Robert Jennings is a 35 y.o. male with an hx of inguinal hernia who presents to the Emergency Department complaining of constant, moderate right-sided groin pain ongoing for the past year, worse in the past 3 hours. Pt states that he has an hx of inguinal hernia with chronic pain that he usually reduces himself but that earlier tonight his pain increased in severity to the point that he can no longer treat it at home. He notes that his last BM was 9 hours ago. He reports that his pain is worse with palpation and is normally improved when lying flat but has not had any relief in the past 3 hours. Per patient, he has an attempted to have his inguinal hernia treated as an outpatient multiple times in the past year but has not been able to afford surgery due to a lack of insurance. Denies vomiting or any other symptom currently.  Of note, patient has been seen 3 additional times in the last 30 days for the same complaint. Had a CT scan at the end of May that showed a fat-containing inguinal hernia  Past Medical History  Diagnosis Date  . Urticaria   . Inguinal hernia    History reviewed. No pertinent past surgical history. History reviewed. No pertinent family history. Social History  Substance Use Topics  . Smoking status: Current Every Day Smoker -- 0.50 packs/day    Types: Cigarettes  . Smokeless tobacco: None  . Alcohol Use: Yes    Review of Systems  Gastrointestinal: Positive for abdominal pain. Negative for vomiting and constipation.        Mass  Musculoskeletal: Myalgias: groin.  All other systems reviewed and are negative.    Allergies  Review of patient's allergies indicates no known allergies.  Home Medications   Prior to Admission medications   Medication Sig Start Date End Date Taking? Authorizing Provider  HYDROcodone-acetaminophen (NORCO/VICODIN) 5-325 MG tablet Take 1 tablet by mouth every 6 (six) hours as needed. 09/03/15   Shon Batonourtney F Horton, MD  ibuprofen (ADVIL,MOTRIN) 600 MG tablet Take 1 tablet (600 mg total) by mouth every 8 (eight) hours as needed. Patient taking differently: Take 600 mg by mouth every 8 (eight) hours as needed for moderate pain.  08/27/15   Azalia BilisKevin Campos, MD   BP 123/78 mmHg  Pulse 71  Temp(Src) 98.4 F (36.9 C) (Oral)  Resp 18  Ht 6\' 1"  (1.854 m)  Wt 200 lb (90.719 kg)  BMI 26.39 kg/m2  SpO2 99% Physical Exam  Constitutional: He is oriented to person, place, and time. He appears well-developed and well-nourished.  Uncomfortable appearing no acute distress  HENT:  Head: Normocephalic and atraumatic.  Cardiovascular: Normal rate, regular rhythm and normal heart sounds.   No murmur heard. Pulmonary/Chest: Effort normal and breath sounds normal. No respiratory distress. He has no wheezes.  Abdominal: Soft. Bowel sounds are normal. He exhibits mass. There is tenderness. There is no rebound.  6 x 3 palpable mass in the right inguinal area, tender to palpation, no overlying  skin changes, reducible with firm pressure  Musculoskeletal: He exhibits no edema.  Neurological: He is alert and oriented to person, place, and time.  Skin: Skin is warm and dry.  Psychiatric: He has a normal mood and affect.  Nursing note and vitals reviewed.   ED Course  Procedures   DIAGNOSTIC STUDIES: Oxygen Saturation is 100% on RA, normal by my interpretation.    COORDINATION OF CARE: 11:19 PM Will order Dilaudid, abdomen x-ray, and labs. Will return to attempt reduction of inguinal hernia. Discussed  treatment plan with pt at bedside and pt agreed to plan.  12:03 AM Returned to reduce inguinal hernia.  12:41 AM Spoke with pt's nurse who stated the pt reported an increase in his pain. Returned to re-evaluate pt. Will order Dilaudid.  Labs Review Labs Reviewed  BASIC METABOLIC PANEL - Abnormal; Notable for the following:    Creatinine, Ser 1.42 (*)    All other components within normal limits  I-STAT CG4 LACTIC ACID, ED - Abnormal; Notable for the following:    Lactic Acid, Venous 0.47 (*)    All other components within normal limits  CBC WITH DIFFERENTIAL/PLATELET    Imaging Review Dg Abd 1 View  09/03/2015  CLINICAL DATA:  34 year old male with right lower quadrant abdominal pain. History of hernia. EXAM: ABDOMEN - 1 VIEW COMPARISON:  Abdominal CT dated 08/20/2015 FINDINGS: The bowel gas pattern is normal. No radio-opaque calculi or other significant radiographic abnormality are seen. IMPRESSION: Negative. Electronically Signed   By: Elgie Collard M.D.   On: 09/03/2015 00:04   I have personally reviewed and evaluated these images and lab results as part of my medical decision-making.   EKG Interpretation None      MDM   Final diagnoses:  Right inguinal hernia    Patient presents with right inguinal hernia. Recurrent. Worsening pain. Hernia is reducible but required pain medications for reduction and did require firm pressure. He has been here multiple times for the same. He has no evidence of obstruction, strangulation, or incarceration at this time. Discussed with Dr. Michaell Cowing given multiple ER visits for the same. No indication at this time for emergent management.  He will need to follow-up as an outpatient.  Will attempt to contact case management to aid the patient with payment issues. Patient states that he is willing to pay what he can. We'll discharge him on a short course of pain medication and surgery follow-up.  After history, exam, and medical workup I feel the  patient has been appropriately medically screened and is safe for discharge home. Pertinent diagnoses were discussed with the patient. Patient was given return precautions.  I personally performed the services described in this documentation, which was scribed in my presence. The recorded information has been reviewed and is accurate.    Shon Baton, MD 09/03/15 [phone]

## 2015-09-02 NOTE — ED Notes (Signed)
PT reports last oral intake at 1600 on 09/02/15

## 2015-09-02 NOTE — ED Notes (Signed)
MD at bedside. 

## 2015-09-02 NOTE — ED Notes (Signed)
Pt w/ known hx of inguinal hernia presents d/t persistent pain since 1900 this evening.  Pt states that he can usually reduce hernia himself however the pain is too severe tonight to achieve that.  Pt rates pain 10/10, sharp and throbbing in nature.

## 2015-09-03 MED ORDER — HYDROMORPHONE HCL 1 MG/ML IJ SOLN
1.0000 mg | Freq: Once | INTRAMUSCULAR | Status: AC
  Administered 2015-09-03: 1 mg via INTRAVENOUS
  Filled 2015-09-03: qty 1

## 2015-09-03 MED ORDER — HYDROCODONE-ACETAMINOPHEN 5-325 MG PO TABS
1.0000 | ORAL_TABLET | Freq: Four times a day (QID) | ORAL | Status: DC | PRN
Start: 2015-09-03 — End: 2015-09-09

## 2015-09-03 NOTE — ED Notes (Signed)
Pt reports increase in pain in right groin. Dr.Horton made aware and evaluated pt. Repeat order for Dilaudid given by Dr.Horton.

## 2015-09-03 NOTE — Discharge Instructions (Signed)
You were seen today for your inguinal hernia. He need to follow-up as an outpatient for surgical repair especially given recurrence of pain. See return precautions below. I will attempt to contact our case manager to help with insurance issues.  Inguinal Hernia, Adult Muscles help keep everything in the body in its proper place. But if a weak spot in the muscles develops, something can poke through. That is called a hernia. When this happens in the lower part of the belly (abdomen), it is called an inguinal hernia. (It takes its name from a part of the body in this region called the inguinal canal.) A weak spot in the wall of muscles lets some fat or part of the small intestine bulge through. An inguinal hernia can develop at any age. Men get them more often than women. CAUSES  In adults, an inguinal hernia develops over time.  It can be triggered by:  Suddenly straining the muscles of the lower abdomen.  Lifting heavy objects.  Straining to have a bowel movement. Difficult bowel movements (constipation) can lead to this.  Constant coughing. This may be caused by smoking or lung disease.  Being overweight.  Being pregnant.  Working at a job that requires long periods of standing or heavy lifting.  Having had an inguinal hernia before. One type can be an emergency situation. It is called a strangulated inguinal hernia. It develops if part of the small intestine slips through the weak spot and cannot get back into the abdomen. The blood supply can be cut off. If that happens, part of the intestine may die. This situation requires emergency surgery. SYMPTOMS  Often, a small inguinal hernia has no symptoms. It is found when a healthcare provider does a physical exam. Larger hernias usually have symptoms.   In adults, symptoms may include:  A lump in the groin. This is easier to see when the person is standing. It might disappear when lying down.  In men, a lump in the scrotum.  Pain or  burning in the groin. This occurs especially when lifting, straining or coughing.  A dull ache or feeling of pressure in the groin.  Signs of a strangulated hernia can include:  A bulge in the groin that becomes very painful and tender to the touch.  A bulge that turns red or purple.  Fever, nausea and vomiting.  Inability to have a bowel movement or to pass gas. DIAGNOSIS  To decide if you have an inguinal hernia, a healthcare provider will probably do a physical examination.  This will include asking questions about any symptoms you have noticed.  The healthcare provider might feel the groin area and ask you to cough. If an inguinal hernia is felt, the healthcare provider may try to slide it back into the abdomen.  Usually no other tests are needed. TREATMENT  Treatments can vary. The size of the hernia makes a difference. Options include:  Watchful waiting. This is often suggested if the hernia is small and you have had no symptoms.  No medical procedure will be done unless symptoms develop.  You will need to watch closely for symptoms. If any occur, contact your healthcare provider right away.  Surgery. This is used if the hernia is larger or you have symptoms.  Open surgery. This is usually an outpatient procedure (you will not stay overnight in a hospital). An cut (incision) is made through the skin in the groin. The hernia is put back inside the abdomen. The weak area  in the muscles is then repaired by herniorrhaphy or hernioplasty. Herniorrhaphy: in this type of surgery, the weak muscles are sewn back together. Hernioplasty: a patch or mesh is used to close the weak area in the abdominal wall.  Laparoscopy. In this procedure, a surgeon makes small incisions. A thin tube with a tiny video camera (called a laparoscope) is put into the abdomen. The surgeon repairs the hernia with mesh by looking with the video camera and using two long instruments. HOME CARE INSTRUCTIONS    After surgery to repair an inguinal hernia:  You will need to take pain medicine prescribed by your healthcare provider. Follow all directions carefully.  You will need to take care of the wound from the incision.  Your activity will be restricted for awhile. This will probably include no heavy lifting for several weeks. You also should not do anything too active for a few weeks. When you can return to work will depend on the type of job that you have.  During "watchful waiting" periods, you should:  Maintain a healthy weight.  Eat a diet high in fiber (fruits, vegetables and whole grains).  Drink plenty of fluids to avoid constipation. This means drinking enough water and other liquids to keep your urine clear or pale yellow.  Do not lift heavy objects.  Do not stand for long periods of time.  Quit smoking. This should keep you from developing a frequent cough. SEEK MEDICAL CARE IF:   A bulge develops in your groin area.  You feel pain, a burning sensation or pressure in the groin. This might be worse if you are lifting or straining.  You develop a fever of more than 100.5 F (38.1 C). SEEK IMMEDIATE MEDICAL CARE IF:   Pain in the groin increases suddenly.  A bulge in the groin gets bigger suddenly and does not go down.  For men, there is sudden pain in the scrotum. Or, the size of the scrotum increases.  A bulge in the groin area becomes red or purple and is painful to touch.  You have nausea or vomiting that does not go away.  You feel your heart beating much faster than normal.  You cannot have a bowel movement or pass gas.  You develop a fever of more than 102.0 F (38.9 C).   This information is not intended to replace advice given to you by your health care provider. Make sure you discuss any questions you have with your health care provider.   Document Released: 07/31/2008 Document Revised: 06/06/2011 Document Reviewed: 09/15/2014 Elsevier Interactive  Patient Education [employer]2016 [employer]

## 2015-09-03 NOTE — ED Notes (Signed)
Pt reports understanding of discharge information. No questions at time of discharge 

## 2015-09-09 ENCOUNTER — Encounter (HOSPITAL_COMMUNITY): Payer: Self-pay | Admitting: Emergency Medicine

## 2015-09-09 ENCOUNTER — Emergency Department (HOSPITAL_COMMUNITY)
Admission: EM | Admit: 2015-09-09 | Discharge: 2015-09-09 | Disposition: A | Discharge status: Home / Self Care | Payer: Self-pay | Attending: Emergency Medicine | Admitting: Emergency Medicine

## 2015-09-09 DIAGNOSIS — Z79899 Other long term (current) drug therapy: Secondary | ICD-10-CM | POA: Insufficient documentation

## 2015-09-09 DIAGNOSIS — Z791 Long term (current) use of non-steroidal anti-inflammatories (NSAID): Secondary | ICD-10-CM | POA: Insufficient documentation

## 2015-09-09 DIAGNOSIS — F17210 Nicotine dependence, cigarettes, uncomplicated: Secondary | ICD-10-CM | POA: Insufficient documentation

## 2015-09-09 DIAGNOSIS — K4090 Unilateral inguinal hernia, without obstruction or gangrene, not specified as recurrent: Secondary | ICD-10-CM

## 2015-09-09 DIAGNOSIS — K4091 Unilateral inguinal hernia, without obstruction or gangrene, recurrent: Secondary | ICD-10-CM | POA: Insufficient documentation

## 2015-09-09 LAB — BASIC METABOLIC PANEL
ANION GAP: 9 (ref 5–15)
BUN: 7 mg/dL (ref 6–20)
CHLORIDE: 100 mmol/L — AB (ref 101–111)
CO2: 28 mmol/L (ref 22–32)
Calcium: 9.3 mg/dL (ref 8.9–10.3)
Creatinine, Ser: 1.53 mg/dL — ABNORMAL HIGH (ref 0.61–1.24)
GFR calc non Af Amer: 57 mL/min — ABNORMAL LOW (ref >60)
GLUCOSE: 93 mg/dL (ref 65–99)
POTASSIUM: 3.4 mmol/L — AB (ref 3.5–5.1)
Sodium: 137 mmol/L (ref 135–145)

## 2015-09-09 LAB — CBC WITH DIFFERENTIAL/PLATELET
BASOS ABS: 0 10*3/uL (ref 0.0–0.1)
BASOS PCT: 0 %
Eosinophils Absolute: 0.3 10*3/uL (ref 0.0–0.7)
Eosinophils Relative: 4 %
HEMATOCRIT: 42.4 % (ref 39.0–52.0)
HEMOGLOBIN: 14.1 g/dL (ref 13.0–17.0)
LYMPHS PCT: 38 %
Lymphs Abs: 3 10*3/uL (ref 0.7–4.0)
MCH: 28.6 pg (ref 26.0–34.0)
MCHC: 33.3 g/dL (ref 30.0–36.0)
MCV: 86 fL (ref 78.0–100.0)
MONO ABS: 0.6 10*3/uL (ref 0.1–1.0)
Monocytes Relative: 7 %
NEUTROS ABS: 4 10*3/uL (ref 1.7–7.7)
NEUTROS PCT: 51 %
Platelets: 296 10*3/uL (ref 150–400)
RBC: 4.93 MIL/uL (ref 4.22–5.81)
RDW: 13.3 % (ref 11.5–15.5)
WBC: 7.9 10*3/uL (ref 4.0–10.5)

## 2015-09-09 LAB — I-STAT CG4 LACTIC ACID, ED
LACTIC ACID, VENOUS: 1.01 mmol/L (ref 0.5–2.0)
Lactic Acid, Venous: 1.27 mmol/L (ref 0.5–2.0)

## 2015-09-09 MED ORDER — SODIUM CHLORIDE 0.9 % IV BOLUS (SEPSIS)
1000.0000 mL | Freq: Once | INTRAVENOUS | Status: AC
  Administered 2015-09-09: 1000 mL via INTRAVENOUS

## 2015-09-09 MED ORDER — HYDROCODONE-ACETAMINOPHEN 5-325 MG PO TABS: 1.0000 | ORAL_TABLET | Freq: Four times a day (QID) | ORAL | Status: DC | PRN

## 2015-09-09 MED ORDER — HYDROMORPHONE HCL 1 MG/ML IJ SOLN
1.0000 mg | Freq: Once | INTRAMUSCULAR | Status: AC
  Administered 2015-09-09: 1 mg via INTRAVENOUS
  Filled 2015-09-09: qty 1

## 2015-09-09 MED ORDER — LORAZEPAM 2 MG/ML IJ SOLN
1.0000 mg | Freq: Once | INTRAMUSCULAR | Status: AC
Start: 2015-09-09 — End: 2015-09-09
  Administered 2015-09-09: 1 mg via INTRAVENOUS
  Filled 2015-09-09: qty 1

## 2015-09-09 MED ORDER — IBUPROFEN 600 MG PO TABS: 600.0000 mg | ORAL_TABLET | Freq: Three times a day (TID) | ORAL | Status: DC | PRN

## 2015-09-09 NOTE — ED Notes (Signed)
Patient waiting on case management.

## 2015-09-09 NOTE — Discharge Planning (Signed)
[facility] consulted to regarding follow-up appointment for pt.  EDCM met with pt at bedside and instructed him to visit [place] [facility]-in [facility] tomorrow at 9:00am as this would be his quickest appointment.  EDCM also urged pt set up financial counseling appointment to enroll in [facility] Discount program and [facility] orange card program.  Pt verbalized importance of keeping appointment.

## 2015-09-09 NOTE — ED Notes (Signed)
Pt ambulatory at discharge without any distress. Discharge instructions reviewed with patient at this time, pt denies any further questions. Reviewed follow up with patient. Per Case Management patient is good to go home at this time. A/o x4.

## 2015-09-09 NOTE — Discharge Instructions (Signed)

## 2015-09-09 NOTE — ED Provider Notes (Signed)
CSN: [id number]     Arrival date & time 09/09/15  0125 History   First MD Initiated Contact with Patient 09/09/15 0449     Chief Complaint  Patient presents with  . Inguinal Hernia     (Consider location/radiation/quality/duration/timing/severity/associated sxs/prior Treatment) HPI   Patient is a 35 year old male history of right inguinal hernia who presents emergency Department with worsening size of hernia and worsening pain in his right groin which is now extending down into his scrotum and causing difficulty with urination and bowel movements.  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 He states that he does not have insurance and has been unable to see a Careers adviser. He has had multiple ER visits for right inguinal hernia and associated pain but states that it has gradually worsened and severely worsened yesterday.  This hernia is usually easily reduced but he states tenderness become larger and more firm and too painful to try and attempt to reduce. The pain is currently rated severe or worse with light touch to the area, worse with movement with associated abdominal pain. He denies any fever, chills, sweats, nausea, vomiting.  Past Medical History  Diagnosis Date  . Urticaria   . Inguinal hernia    History reviewed. No pertinent past surgical history. No family history on file. Social History  Substance Use Topics  . Smoking status: Current Every Day Smoker -- 0.50 packs/day    Types: Cigarettes  . Smokeless tobacco: None  . Alcohol Use: Yes    Review of Systems  All other systems reviewed and are negative.     Allergies  Review of patient's allergies indicates no known allergies.  Home Medications   Prior to Admission medications   Medication Sig Start Date End Date Taking? Authorizing Provider  HYDROcodone-acetaminophen  (NORCO/VICODIN) 5-325 MG tablet Take 1 tablet by mouth every 6 (six) hours as needed. 09/09/15   Danelle Berry, PA-C  ibuprofen (ADVIL,MOTRIN) 600 MG tablet Take 1 tablet (600 mg total) by mouth every 8 (eight) hours as needed. 09/09/15   Danelle Berry, PA-C   BP 132/94 mmHg  Pulse 77  Temp(Src) 98.2 F (36.8 C) (Oral)  Resp 16  Ht  (1.854 m)  Wt 90.719 kg  BMI 26.39 kg/m2  SpO2 98% Physical Exam  Constitutional: He is oriented to person, place, and time. He appears well-developed and well-nourished. He appears distressed.  Well appearing male, appears stated age, writhing in pain, unable to stand still, appears very uncomfortable  HENT:  Head: Normocephalic and atraumatic.  Nose: Nose normal.  Mouth/Throat: Oropharynx is clear and moist. No oropharyngeal exudate.  Eyes: Conjunctivae and EOM are normal. Pupils are equal, round, and reactive to light. Right eye exhibits no discharge. Left eye exhibits no discharge. No scleral icterus.  Neck: Normal range of motion. Neck supple. No JVD present.  Cardiovascular: Normal rate, regular rhythm, normal heart sounds and intact distal pulses.   Pulmonary/Chest: Effort normal and breath sounds normal. No stridor. No respiratory distress.  Abdominal: Soft. Bowel sounds are normal. He exhibits mass. He exhibits no distension. There is tenderness. There is guarding.  Large right groin mass, 10 x 6 cm, firm, very ttp, no edema or erythema of scrotum but right scrotum ttp Generalized abdominal ttp with guarding  Musculoskeletal: Normal range of motion. He exhibits no edema  or tenderness.  Lymphadenopathy:    He has no cervical adenopathy.  Neurological: He is alert and oriented to person, place, and time. No cranial nerve deficit. Coordination normal.  Skin: Skin is warm and dry. No rash noted. He is not diaphoretic. No erythema.  Psychiatric: He has a normal mood and affect. His behavior is normal. Judgment and thought content normal.  Nursing note  and vitals reviewed.   ED Course  Procedures (including critical care time) Labs Review Labs Reviewed  BASIC METABOLIC PANEL - Abnormal; Notable for the following:    Potassium 3.4 (*)    Chloride 100 (*)    Creatinine, Ser 1.53 (*)    GFR calc non Af Amer 57 (*)    All other components within normal limits  CBC WITH DIFFERENTIAL/PLATELET  I-STAT CG4 LACTIC ACID, ED  I-STAT CG4 LACTIC ACID, ED    Imaging Review No results found. I have personally reviewed and evaluated these images and lab results as part of my medical decision-making.   EKG Interpretation None      MDM   Patient with known right inguinal hernia, presents to emergency room with enlarged hernia area and severe pain, prolapsed since 5pm, unable to reduce secondary to pain.  No N, V, Initially on exam patient had right periumbilical and right lower quadrant tenderness with guarding, abdomen non-distended, normal BS.  Patient was unable to tolerate manual reduction, and he was placed in Trendelenburg and had ice applied to inguinal area, still unable to reduce.  He complained of inability to urinate or have bowel movement secondary to pain however in the ER he was able to urinate.  IV meds and basic labs obtained.  Patient was given pain medication and Ativan and pt continued in trendelenburg and hernia reduced with gravity and ice.  Abdomen was reevaluated, soft, nontender, normal bowel sounds 4.  Scrotum is normal in appearance, with palpated and direct hernia defect palpated on the right, tender to palpation.    Patient had negative lactic acid, labwork remarkable for mild hypokalemia, elevated temperature creatinine 1.53, consistent with recent labs, no concern for AKI.   Leukocytosis and no constitutional symptoms, no imaging required with successful reduction and unremarkable lab workup.  Return precautions were reviewed, patient was given again general surgery referral for follow-up.  Reviewed treatment for  prolapsed hernia.  His was discharge pain medication, in good condition, with stable vital signs.  Final diagnoses:  Right inguinal hernia      Danelle BerryLeisa Kelee Cunningham, PA-C 09/10/15 0803  Layla MawKristen N Ward, DO 09/10/15 [id number]

## 2015-09-09 NOTE — ED Notes (Signed)
Pt c/o right hernia pain x's 2 days.  Pt has been several times in the past for same.  Pt st;s he doesn't have insurance so he can't have surg

## 2015-09-16 ENCOUNTER — Emergency Department (HOSPITAL_COMMUNITY)
Admission: EM | Admit: 2015-09-16 | Discharge: 2015-09-16 | Disposition: A | Discharge status: Home / Self Care | Payer: Self-pay | Attending: Emergency Medicine | Admitting: Emergency Medicine

## 2015-09-16 ENCOUNTER — Encounter (HOSPITAL_COMMUNITY): Payer: Self-pay | Admitting: *Deleted

## 2015-09-16 DIAGNOSIS — Z79899 Other long term (current) drug therapy: Secondary | ICD-10-CM | POA: Insufficient documentation

## 2015-09-16 DIAGNOSIS — K4091 Unilateral inguinal hernia, without obstruction or gangrene, recurrent: Secondary | ICD-10-CM

## 2015-09-16 DIAGNOSIS — F17210 Nicotine dependence, cigarettes, uncomplicated: Secondary | ICD-10-CM | POA: Insufficient documentation

## 2015-09-16 LAB — URINALYSIS, ROUTINE W REFLEX MICROSCOPIC
Bilirubin Urine: NEGATIVE
Glucose, UA: NEGATIVE mg/dL
Hgb urine dipstick: NEGATIVE
Ketones, ur: NEGATIVE mg/dL
Leukocytes, UA: NEGATIVE
Nitrite: NEGATIVE
Protein, ur: NEGATIVE mg/dL
Specific Gravity, Urine: 1.023 (ref 1.005–1.030)
pH: 6.5 (ref 5.0–8.0)

## 2015-09-16 LAB — CBC WITH DIFFERENTIAL/PLATELET
Basophils Absolute: 0 K/uL (ref 0.0–0.1)
Basophils Relative: 0 %
Eosinophils Absolute: 0.4 K/uL (ref 0.0–0.7)
Eosinophils Relative: 4 %
HCT: 46.2 % (ref 39.0–52.0)
Hemoglobin: 16 g/dL (ref 13.0–17.0)
Lymphocytes Relative: 27 %
Lymphs Abs: 2.7 K/uL (ref 0.7–4.0)
MCH: 29.5 pg (ref 26.0–34.0)
MCHC: 34.6 g/dL (ref 30.0–36.0)
MCV: 85.1 fL (ref 78.0–100.0)
Monocytes Absolute: 0.5 K/uL (ref 0.1–1.0)
Monocytes Relative: 5 %
Neutro Abs: 6.5 K/uL (ref 1.7–7.7)
Neutrophils Relative %: 64 %
Platelets: 300 K/uL (ref 150–400)
RBC: 5.43 MIL/uL (ref 4.22–5.81)
RDW: 13.7 % (ref 11.5–15.5)
WBC: 10.1 K/uL (ref 4.0–10.5)

## 2015-09-16 LAB — I-STAT CHEM 8, ED
BUN: 9 mg/dL (ref 6–20)
Calcium, Ion: 1.16 mmol/L (ref 1.12–1.23)
Chloride: 100 mmol/L — ABNORMAL LOW (ref 101–111)
Creatinine, Ser: 1.5 mg/dL — ABNORMAL HIGH (ref 0.61–1.24)
Glucose, Bld: 84 mg/dL (ref 65–99)
HCT: 50 % (ref 39.0–52.0)
Hemoglobin: 17 g/dL (ref 13.0–17.0)
Potassium: 3.9 mmol/L (ref 3.5–5.1)
Sodium: 140 mmol/L (ref 135–145)
TCO2: 30 mmol/L (ref 0–100)

## 2015-09-16 MED ORDER — HYDROMORPHONE HCL 1 MG/ML IJ SOLN
1.0000 mg | Freq: Once | INTRAMUSCULAR | Status: AC
  Administered 2015-09-16: 1 mg via INTRAVENOUS
  Filled 2015-09-16: qty 1

## 2015-09-16 MED ORDER — HYDROCODONE-ACETAMINOPHEN 5-325 MG PO TABS: 1.0000 | ORAL_TABLET | Freq: Four times a day (QID) | ORAL | Status: DC | PRN

## 2015-09-16 NOTE — ED Notes (Signed)
Notified Samantha,PA that pt is requesting additional pain medication.

## 2015-09-16 NOTE — ED Notes (Signed)
Patient was diagnosed with inguinal hernia 1 year ago.  Patient presents with RLQ/pelvic pain that began again this morning.  Patient is able to pass urine, but c/o hematuria.  Patient has been referred to surgeon, but is uninsured.  Patient c/o N/V/D.

## 2015-09-16 NOTE — Discharge Instructions (Signed)
Inguinal Hernia, Adult °Muscles help keep everything in the body in its proper place. But if a weak spot in the muscles develops, something can poke through. That is called a hernia. When this happens in the lower part of the belly (abdomen), it is called an inguinal hernia. (It takes its name from a part of the body in this region called the inguinal canal.) A weak spot in the wall of muscles lets some fat or part of the small intestine bulge through. An inguinal hernia can develop at any age. Men get them more often than women. °CAUSES  °In adults, an inguinal hernia develops over time. °· It can be triggered by: °¨ Suddenly straining the muscles of the lower abdomen. °¨ Lifting heavy objects. °¨ Straining to have a bowel movement. Difficult bowel movements (constipation) can lead to this. °¨ Constant coughing. This may be caused by smoking or lung disease. °¨ Being overweight. °¨ Being pregnant. °¨ Working at a job that requires long periods of standing or heavy lifting. °¨ Having had an inguinal hernia before. °One type can be an emergency situation. It is called a strangulated inguinal hernia. It develops if part of the small intestine slips through the weak spot and cannot get back into the abdomen. The blood supply can be cut off. If that happens, part of the intestine may die. This situation requires emergency surgery. °SYMPTOMS  °Often, a small inguinal hernia has no symptoms. It is found when a healthcare provider does a physical exam. Larger hernias usually have symptoms.  °· In adults, symptoms may include: °¨ A lump in the groin. This is easier to see when the person is standing. It might disappear when lying down. °¨ In men, a lump in the scrotum. °¨ Pain or burning in the groin. This occurs especially when lifting, straining or coughing. °¨ A dull ache or feeling of pressure in the groin. °· Signs of a strangulated hernia can include: °¨ A bulge in the groin that becomes very painful and tender to the  touch. °¨ A bulge that turns red or purple. °¨ Fever, nausea and vomiting. °¨ Inability to have a bowel movement or to pass gas. °DIAGNOSIS  °To decide if you have an inguinal hernia, a healthcare provider will probably do a physical examination. °· This will include asking questions about any symptoms you have noticed. °· The healthcare provider might feel the groin area and ask you to cough. If an inguinal hernia is felt, the healthcare provider may try to slide it back into the abdomen. °· Usually no other tests are needed. °TREATMENT  °Treatments can vary. The size of the hernia makes a difference. Options include: °· Watchful waiting. This is often suggested if the hernia is small and you have had no symptoms. °¨ No medical procedure will be done unless symptoms develop. °¨ You will need to watch closely for symptoms. If any occur, contact your healthcare provider right away. °· Surgery. This is used if the hernia is larger or you have symptoms. °¨ Open surgery. This is usually an outpatient procedure (you will not stay overnight in a hospital). An cut (incision) is made through the skin in the groin. The hernia is put back inside the abdomen. The weak area in the muscles is then repaired by herniorrhaphy or hernioplasty. Herniorrhaphy: in this type of surgery, the weak muscles are sewn back together. Hernioplasty: a patch or mesh is used to close the weak area in the abdominal wall. °¨ Laparoscopy.   In this procedure, a surgeon makes small incisions. A thin tube with a tiny video camera (called a laparoscope) is put into the abdomen. The surgeon repairs the hernia with mesh by looking with the video camera and using two long instruments. HOME CARE INSTRUCTIONS   After surgery to repair an inguinal hernia:  You will need to take pain medicine prescribed by your healthcare provider. Follow all directions carefully.  You will need to take care of the wound from the incision.  Your activity will be  restricted for awhile. This will probably include no heavy lifting for several weeks. You also should not do anything too active for a few weeks. When you can return to work will depend on the type of job that you have.  During "watchful waiting" periods, you should:  Maintain a healthy weight.  Eat a diet high in fiber (fruits, vegetables and whole grains).  Drink plenty of fluids to avoid constipation. This means drinking enough water and other liquids to keep your urine clear or pale yellow.  Do not lift heavy objects.  Do not stand for long periods of time.  Quit smoking. This should keep you from developing a frequent cough. SEEK MEDICAL CARE IF:   A bulge develops in your groin area.  You feel pain, a burning sensation or pressure in the groin. This might be worse if you are lifting or straining.  You develop a fever of more than 100.5 F (38.1 C). SEEK IMMEDIATE MEDICAL CARE IF:   Pain in the groin increases suddenly.  A bulge in the groin gets bigger suddenly and does not go down.  For men, there is sudden pain in the scrotum. Or, the size of the scrotum increases.  A bulge in the groin area becomes red or purple and is painful to touch.  You have nausea or vomiting that does not go away.  You feel your heart beating much faster than normal.  You cannot have a bowel movement or pass gas.  You develop a fever of more than 102.0 F (38.9 C).   This information is not intended to replace advice given to you by your health care provider. Make sure you discuss any questions you have with your health care provider.   [facility] to be seen by a primary care provider. You will need a referral to a general surgeon. They have walk in clinic on Thursdays and Fridays, please arrive at 9am to ensure that you get seen as they only take the first 50 patients. Return to the ED if you experience severe worsening of your symptoms, inability  to reduce your hernia, fever, inability to have a bowel movement.

## 2015-09-18 NOTE — ED Provider Notes (Signed)
CSN: [id number]     Arrival date & time 09/16/15  1453 History   First MD Initiated Contact with Patient 09/16/15 1830     Chief Complaint  Patient presents with  . Abdominal Pain     (Consider location/radiation/quality/duration/timing/severity/associated sxs/prior Treatment) HPI   Robert Jennings is a 35 y.o M who presents to the ED c/o pain in his right groin. Pt has a known R inguinal hernia that has been present for several months. Pt states he is typically able to reduce his hernia on his own by pushing on it however, for the last week he has not been able to push it back in. For the last 2 days his pain has become severe and it now radiating into his right scrotum. He also states that he noticed blood in his urine today. Pt also reports constipation and difficulty urinating. Pt states that he has been referred to general surgery several times but has been unable to go because he does not have any insurance. He denies and fevers, chills, vomiting.  Past Medical History  Diagnosis Date  . Urticaria   . Inguinal hernia    History reviewed. No pertinent past surgical history. No family history on file. Social History  Substance Use Topics  . Smoking status: Current Every Day Smoker -- 0.50 packs/day    Types: Cigarettes  . Smokeless tobacco: Never Used  . Alcohol Use: Yes    Review of Systems  All other systems reviewed and are negative.     Allergies  Review of patient's allergies indicates no known allergies.  Home Medications   Prior to Admission medications   Medication Sig Start Date End Date Taking? Authorizing Provider  acetaminophen (TYLENOL) 500 MG tablet Take 1,000 mg by mouth every 6 (six) hours as needed for moderate pain.   Yes Historical Provider, MD  HYDROcodone-acetaminophen (NORCO/VICODIN) 5-325 MG tablet Take 1 tablet by mouth every 6 (six) hours as needed. 09/16/15   Samantha Tripp Dowless, PA-C  ibuprofen (ADVIL,MOTRIN) 600 MG tablet Take 1 tablet (600  mg total) by mouth every 8 (eight) hours as needed. Patient not taking: Reported on 09/16/2015 09/09/15   Danelle BerryLeisa Tapia, PA-C   BP 120/88 mmHg  Pulse 97  Temp(Src) 99.7 F (37.6 C) (Oral)  Resp 18  Ht 6\' 2"  (1.88 m)  Wt 90.719 kg  BMI 25.67 kg/m2  SpO2 100% Physical Exam  Constitutional: He is oriented to person, place, and time. He appears well-developed and well-nourished. No distress.  HENT:  Head: Normocephalic and atraumatic.  Mouth/Throat: No oropharyngeal exudate.  Eyes: Conjunctivae and EOM are normal. Pupils are equal, round, and reactive to light. Right eye exhibits no discharge. Left eye exhibits no discharge. No scleral icterus.  Cardiovascular: Normal rate, regular rhythm, normal heart sounds and intact distal pulses.  Exam reveals no gallop and no friction rub.   No murmur heard. Pulmonary/Chest: Effort normal and breath sounds normal. No respiratory distress. He has no wheezes. He has no rales. He exhibits no tenderness.  Abdominal: Soft. He exhibits no distension. There is no tenderness. There is no guarding.  Genitourinary:  Large mass in R inguinal area. Mass is firm and very TTP. Obvious hernia, not reducible. TTP of right scrotum.   Musculoskeletal: Normal range of motion. He exhibits no edema.  Neurological: He is alert and oriented to person, place, and time.  Skin: Skin is warm and dry. No rash noted. He is not diaphoretic. No erythema. No pallor.  Psychiatric: He has  a normal mood and affect. His behavior is normal.  Nursing note and vitals reviewed.   ED Course  Procedures (including critical care time) Labs Review Labs Reviewed  I-STAT CHEM 8, ED - Abnormal; Notable for the following:    Chloride 100 (*)    Creatinine, Ser 1.50 (*)    All other components within normal limits  CBC WITH DIFFERENTIAL/PLATELET  URINALYSIS, ROUTINE W REFLEX MICROSCOPIC (NOT AT [facility])    Imaging Review No results found. I have personally reviewed and evaluated these images  and lab results as part of my medical decision-making.   EKG Interpretation None      MDM   Final diagnoses:  Unilateral recurrent inguinal hernia without obstruction or gangrene    Pt with known R inguinal hernia presents to the ED with enlargement of his hernia and associated pain. Pt unable to reduce his hernia on his own secondary to pain. Pt unable to tolerate manual reduction. He was placed in trendelenberg and had ice applied to groin. Pt was also given pain medication. Pt also complained of hematuria however, no blood was seen on UA and he had no difficulty urinating in ED. Upon re-exam pt was able to manually reduce his hernia after pain medication. No evidence of incarceration. Lab work review and is unremarkable. Elevation in creatinine appears to be pts baseline. Pt again given referral top general surgery. I [facility]. Pt d/c home with short course of pain medications. Discussed tx plan with pt who is agreeable. Return precautions outlined in patient discharge instructions.     Lester KinsmanSamantha Tripp Fancy FarmDowless, PA-C 09/18/15 1440  Marily MemosJason Mesner, MD 09/21/15 [phone]

## 2015-09-22 ENCOUNTER — Encounter (HOSPITAL_COMMUNITY): Payer: Self-pay | Admitting: Emergency Medicine

## 2015-09-22 ENCOUNTER — Emergency Department (HOSPITAL_COMMUNITY)
Admission: EM | Admit: 2015-09-22 | Discharge: 2015-09-23 | Disposition: A | Discharge status: Home / Self Care | Payer: Self-pay | Attending: Emergency Medicine | Admitting: Emergency Medicine

## 2015-09-22 DIAGNOSIS — F17210 Nicotine dependence, cigarettes, uncomplicated: Secondary | ICD-10-CM | POA: Insufficient documentation

## 2015-09-22 DIAGNOSIS — T401X1A Poisoning by heroin, accidental (unintentional), initial encounter: Secondary | ICD-10-CM | POA: Insufficient documentation

## 2015-09-22 DIAGNOSIS — Z791 Long term (current) use of non-steroidal anti-inflammatories (NSAID): Secondary | ICD-10-CM | POA: Insufficient documentation

## 2015-09-22 NOTE — ED Provider Notes (Signed)
CSN: [id number]     Arrival date & time 09/22/15  2317 History  By signing my name below, I, Phillis HaggisGabriella Gaje, attest that this documentation has been prepared under the direction and in the presence of Marlon Peliffany Sahalie Beth, PA-C. Electronically Signed: Phillis HaggisGabriella Gaje, ED Scribe. 09/22/2015. 11:33 PM.   Chief Complaint  Patient presents with  . Drug Overdose   The history is provided by the patient. No language interpreter was used.  HPI Comments: Robert Jennings is a 35 y.o. male brought in by EMS who presents to the Emergency Department complaining of an unintentional drug overdose onset PTA. 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 He reports that he used heroin approximately 1 hour ago and only used a small amount < than $10 worth from a dealer he trusts. Denies any other drugs or that he believes it was laced with any other substance. Pt states that he is embarrassed of himself, that he is a failure because he relapsed. Pt's best friend called EMS and Narcan was given en route. Pt reports chronic pain with an inguinal hernia. He denies alcohol use, other drug use, or SI . He denies chest pain or new abdominal pain.  Denies SI or intent to harm x 3.  Past Medical History  Diagnosis Date  . Urticaria   . Inguinal hernia    Past Surgical History  Procedure Laterality Date  . Tendon repair     No family history on file. Social History  Substance Use Topics  . Smoking status: Current Every Day Smoker -- 0.50 packs/day    Types: Cigarettes  . Smokeless tobacco: Never Used  . Alcohol Use: Yes    Review of Systems  Cardiovascular: Negative for chest pain.  Gastrointestinal: Negative for abdominal pain.  Psychiatric/Behavioral: Positive for self-injury. Negative for suicidal ideas.  All other systems reviewed and are negative.   Allergies  Review of patient's allergies indicates no known allergies.  Home Medications   Prior to Admission medications    Medication Sig Start Date End Date Taking? Authorizing Provider  acetaminophen (TYLENOL) 500 MG tablet Take 1,000 mg by mouth every 6 (six) hours as needed for moderate pain.   Yes Historical Provider, MD  HYDROcodone-acetaminophen (NORCO/VICODIN) 5-325 MG tablet Take 1 tablet by mouth every 6 (six) hours as needed. Patient not taking: Reported on 09/22/2015 09/16/15   Samantha Tripp Dowless, PA-C  ibuprofen (ADVIL,MOTRIN) 600 MG tablet Take 1 tablet (600 mg total) by mouth every 8 (eight) hours as needed. Patient not taking: Reported on 09/16/2015 09/09/15   Danelle BerryLeisa Tapia, PA-C   BP 123/90 mmHg  Pulse 105  Temp(Src) 98.1 F (36.7 C) (Oral)  Resp 20  Ht 6\' 2"  (1.88 m)  Wt 90.719 kg  BMI 25.67 kg/m2  SpO2 95% Physical Exam  Constitutional: He is oriented to person, place, and time. He appears well-developed and well-nourished.  HENT:  Head: Normocephalic and atraumatic.  Eyes: EOM are normal.  Neck: Normal range of motion. Neck supple.  Cardiovascular: Normal rate.   Pulmonary/Chest: Effort normal.  Musculoskeletal: Normal range of motion.  Neurological: He is alert and oriented to person, place, and time.  Pt is drowsy but awake easily to verbal stimuli.  Skin: Skin is warm and dry.  Psychiatric: His speech is normal and behavior is normal. Thought content normal. His mood appears not anxious. He exhibits a depressed mood. He expresses no homicidal and no suicidal ideation. He expresses no suicidal  plans and no homicidal plans.  Nursing note and vitals reviewed.   ED Course  Procedures (including critical care time) DIAGNOSTIC STUDIES: Oxygen Saturation is 86% on 2L O2, low by my interpretation.    COORDINATION OF CARE: 11:45 PM-Discussed treatment plan which includes labs and monitoring with pt at bedside and pt agreed to plan.    Labs Review Labs Reviewed - No data to display  Imaging Review No results found. I have personally reviewed and evaluated these images and lab  results as part of my medical decision-making.   EKG Interpretation   Date/Time:  Tuesday September 22 2015 23:28:12 EDT Ventricular Rate:  103 PR Interval:    QRS Duration: 103 QT Interval:  328 QTC Calculation: 430 R Axis:   59 Text Interpretation:  Sinus tachycardia ST elev, probable normal early  repol pattern No significant change since last tracing in 2012 Confirmed  by WARD,  DO, KRISTEN [phone]) on 09/22/2015 11:59:31 PM      MDM   Final diagnoses:  Heroin overdose, accidental or unintentional, initial encounter   Patient Given Narcan in the field and is now awake and alert but drowsy.Marland Kitchen. He is mildly tachycardic at 100 bpm. Oxygen saturation 95% on 2L  Will allow patient to sober.  2:41 am Attempted to walk patient in room but he felt dizzy and weak. He became mildly tachycardic. Will give a liter of IV fluids.    Nena AlexanderCourtney A Jimerson, RN 09/23/2015 04:58   Expand All Collapse All   Pt ambulated down the hall to the bathroom and back. No complaints of pain or signs of distress.        Patient has a ride here, is sating 100% on room air and is ready for home. He is well appearing, tearful because he is sad he relapsed. He has a ride here to take him home.  I personally performed the services described in this documentation, which was scribed in my presence. The recorded information has been reviewed and is accurate.    Marlon Peliffany Kile Kabler, PA-C 09/23/15 0514  Layla MawKristen N Ward, DO 09/23/15 [phone]

## 2015-09-22 NOTE — ED Notes (Signed)
Pt BIB EMS from friend's house. Pt found in respiratory arrest at scene. 0.4mg  Narcan administered intranasal. Respirations returned in response to narcan. Pt admitted to doing heroin. Pt spO2 90% on RA and improved to 95% with 2L oxygen [place]. Pt alert and oriented in triage.   EMS vitals: BP 156/120 HR 140 Resp 20

## 2015-09-23 MED ORDER — SODIUM CHLORIDE 0.9 % IV BOLUS (SEPSIS)
1000.0000 mL | Freq: Once | INTRAVENOUS | Status: AC
  Administered 2015-09-23: 1000 mL via INTRAVENOUS

## 2015-09-23 NOTE — ED Notes (Signed)
Pt ambulated down the hall to the bathroom and back. No complaints of pain or signs of distress.

## 2015-09-23 NOTE — Discharge Instructions (Signed)
Accidental Overdose °A drug overdose occurs when a chemical substance (drug or medication) is used in amounts large enough to overcome a person. This may result in severe illness or death. This is a type of poisoning. Accidental overdoses of medications or other substances come from a variety of reasons. When this happens accidentally, it is often because the person taking the substance does not know enough about what they have taken. Drugs which commonly cause overdose deaths are alcohol, psychotropic medications (medications which affect the mind), pain medications, illegal drugs (street drugs) such as cocaine and heroin, and multiple drugs taken at the same time. It may result from careless behavior (such as over-indulging at a party). Other causes of overdose may include multiple drug use, a lapse in memory, or drug use after a period of no drug use.  °Sometimes overdosing occurs because a person cannot remember if they have taken their medication.  °A common unintentional overdose in young children involves multi-vitamins containing iron. Iron is a part of the hemoglobin molecule in blood. It is used to transport oxygen to living cells. When taken in small amounts, iron allows the body to restock hemoglobin. In large amounts, it causes problems in the body. If this overdose is not treated, it can lead to death. °Never take medicines that show signs of tampering or do not seem quite right. Never take medicines in the dark or in poor lighting. Read the label and check each dose of medicine before you take it. When adults are poisoned, it happens most often through carelessness or lack of information. Taking medicines in the dark or taking medicine prescribed for someone else to treat the same type of problem is a dangerous practice. °SYMPTOMS  °Symptoms of overdose depend on the medication and amount taken. They can vary from over-activity with stimulant over-dosage, to sleepiness from depressants such as  alcohol, narcotics and tranquilizers. Confusion, dizziness, nausea and vomiting may be present. If problems are severe enough coma and death may result. °DIAGNOSIS  °Diagnosis and management are generally straightforward if the drug is known. Otherwise it is more difficult. At times, certain symptoms and signs exhibited by the patient, or blood tests, can reveal the drug in question.  °TREATMENT  °In an emergency department, most patients can be treated with supportive measures. Antidotes may be available if there has been an overdose of opioids or benzodiazepines. A rapid improvement will often occur if this is the cause of overdose. °At home or away from medical care: °· There may be no immediate problems or warning signs in children. °· Not everything works well in all cases of poisoning. °· Take immediate action. Poisons may act quickly. °· If you think someone has swallowed medicine or a household product, and the person is unconscious, having seizures (convulsions), or is not breathing, immediately call for an ambulance. °IF a person is conscious and appears to be doing OK but has swallowed a poison: °· Do not wait to see what effect the poison will have. Immediately call a poison control center (listed in the white pages of your telephone book under "Poison Control" or inside the front cover with other emergency numbers). Some poison control centers have TTY capability for the deaf. Check with your local center if you or someone in your family requires this service. °· Keep the container so you can read the label on the product for ingredients. °· Describe what, when, and how much was taken and the age and condition of the person poisoned.   Inform them if the person is vomiting, choking, drowsy, shows a change in color or temperature of skin, is conscious or unconscious, or is convulsing.  Do not cause vomiting unless instructed by medical personnel. Do not induce vomiting or force liquids into a person who  is convulsing, unconscious, or very drowsy. Stay calm and in control.   Activated charcoal also is sometimes used in certain types of poisoning and you may wish to add a supply to your emergency medicines. It is available without a prescription. Call a poison control center before using this medication. PREVENTION  Thousands of children die every year from unintentional poisoning. This may be from household chemicals, poisoning from carbon monoxide in a car, taking their parent's medications, or simply taking a few iron pills or vitamins with iron. Poisoning comes from unexpected sources.  Store medicines out of the sight and reach of children, preferably in a locked cabinet. Do not keep medications in a food cabinet. Always store your medicines in a secure place. Get rid of expired medications.  If you have children living with you or have them as occasional guests, you should have child-resistant caps on your medicine containers. Keep everything out of reach. Child proof your home.  If you are called to the telephone or to answer the door while you are taking a medicine, take the container with you or put the medicine out of the reach of small children.  Do not take your medication in front of children. Do not tell your child how good a medication is and how good it is for them. They may get the idea it is more of a treat.  If you are an adult and have accidentally taken an overdose, you need to consider how this happened and what can be done to prevent it from happening again. If this was from a street drug or alcohol, determine if there is a problem that needs addressing. If you are not sure a problems exists, it is easy to talk to a professional and ask them if they think you have a problem. It is better to handle this problem in this way before it happens again and has a much worse consequence.   This information is not intended to replace advice given to you by your health care provider. Make  sure you discuss any questions you have with your health care provider.   Document Released: 05/28/2004 Document Revised: 04/04/2014 Document Reviewed: 09/01/2014 Elsevier Interactive Patient Education 2016 [employer]  Therapists  Name Address  Phone Number  [facility] - Psychotherapy [address] Crissie Figures [place]  [place]  [address] [phone]  [facility] [address]  [place], [address] [place]  [place], [place] Victor (sliding Fee) [address] [place] [address] [place] Counseling (sliding fee) [place]. [facility] [employer] [address] [place]  [facility] and [place], [place], [place], [place], [place]  Burnard Leigh, [place] [address] [place], [place]  [phone] [address] [place], [place]  Nunzio Cobbs  [place] [phone]  Tomi Bamberger, PhD [address] [phone]  Pennelope Bracken, [place], [place] [address] [facility]  [phone]  Windee Knox-Heitkamp [address]  Altha Harm, PhD [facility] [address] [place]  Hulen Luster, [facility], [address] [place], [place]  Nena Polio, PhD [place]  [address] [place], LCSW [address] [employer]. [place] [phone]  Loleta Rose, PhD  [phone]  Rondel Oh, EDD, LMFT [facility]  [address] Hervey Ard [phone]  Pervis Hocking, PhD [phone] [address] [place], [address]  Ky Barban [phone] [address] [place]. [place] [phone]  Agustina Caroli, PhD [phone]               Couples Therapists  [facility]  [place]  Karin Golden, PhD [address] [place]  Apolonio Schneiders [address] [place]      Psychiatrists  Name Address Phone Number Fax Number  [place]  [address] # [phone], [place] [phone]  [place] [address], [place] [phone]  [place]. [place] [phone]   [place] [phone]   [place]. [address], [place] [phone]   Dr. Ambrose Finland [address], [address], [place] [phone]  [facility]  [address] Drucie Ip [phone]   Sheralyn Boatman, MD [address], [address] [place] [phone]  Launa Flight, MD [address] [place], [place] [phone]   Ree Shay, MD [address], [place] [phone]   Fredonia Highland MD [place]   Terrilyn Saver  MD [address] [place] [phone]  Elane Fritz, [place] [employer] [phone]   [address] [place]   Pearson Grippe, MD [address], [phone] [address] [facility] [phone]                   [facility] Support  Assertive  Community Treatment [facility], [place]  [address] [place]  [facility] [address] [address].org [phone]              RESOURCE GUIDE  Chronic Pain Problems: Contact [facility]  [phone] Patients need to be referred by their primary care doctor.  Insufficient Money for Medicine: Contact [employer] Way:  call "211" or [place] [phone].  No Primary Care Doctor: Call Health Connect  [phone] - can help you locate a primary care doctor that  accepts your insurance, provides certain services, etc. Physician Referral [phone]  Agencies that provide inexpensive medical care: [facility]  [facility]  [phone] [facility]  [phone] [facility]  [phone] Planned Parenthood  [facility]  [phone]  [place] Providers: [facility]- [address]  [phone], Mon-Fri 9am-7pm, Sat 9am-1pm [place], [address] [place]  [place], [address] [place]  [facility] Medicine- [address]  [place], [address], [phone]  Only accepts [place] Access [place] patients after they have their name  applied to their card  Self Pay (no insurance) in [facility]: Sickle Cell Patients: Dr Kevan Ny, [facility]  [place], [facility]- [address] Vaya  [place] Urgent [place]- [address], [place] [facility]- see information above (Speak to [employer] if you do not have insurance)       -  [facility] Serve- [place], [place]  Alvin,  [place], [place]  Dr Vista Lawman-  [address], [place], [place] Urgent Care- [address] [phone]       -  [facility]- [address], [place], also [address] [phone]       -    [facility]- [address], [place], 1st & 3rd Saturday   every month, 10am-1pm  1) Find a Doctor and Pay Out of Pocket Although you won't have to find out who is covered by your insurance plan, it is a good idea to ask around and get recommendations. You will then need to call the office and see if the doctor you have chosen will accept you as a new patient and what types of options they offer for patients who are self-pay. Some doctors offer discounts or will set up payment plans for their patients who do not have insurance, but you will need to ask so you aren't surprised when you get to your appointment.  2) Contact Your Local Health Department Not all health departments have doctors that can see patients for sick visits, but many do, so it is worth a call to see if yours does. If you don't know where your local health department is, you can check in your phone book. The CDC also has a tool to help you locate your state's health department, and many state websites also have listings of all of their local health departments.  3) Find a [place] [facility] If your illness is not likely to be very severe or complicated, you may want to try a walk in clinic. These are popping up all over the country in pharmacies, drugstores, and shopping centers. They're usually staffed by nurse practitioners or physician assistants that have been trained to treat common illnesses and complaints. They're usually fairly quick and inexpensive. However, if you have serious medical issues or chronic medical problems, these are probably not your best option  STD [place], [facility], [address], [place], phone [phone] or [phone].  Monday - Friday, call for an appointment. [place], STD Clinic, [address], [place], phone [phone] or [phone].  Monday - Friday, call for an appointment.  Abuse/Neglect: [place] [phone]  [place] [phone] (After Hours)  Emergency Shelter:  [facility] [phone]  Maternity Homes: Room at the [facility] [phone] [place] [phone]  MRSA Hotline #:   919-224-4878  [facility] Dept. [address]         [place] Phone:  [phone]                                  Phone:  [phone]                   Phone:  [phone]  [facility], [place]- [phone]       -     [facility] in [place], [address],                                  [place] [phone] or [phone] (After Hours)   [place]  Substance Abuse Resources: Alcohol and Drug Services  [phone] [place] 5011771746 The [place] Chinita Pester [phone] Residential & Outpatient Substance Abuse Program  3084944174  Psychological Services: [place]  [phone] [place]  [place], [place]. [address], [place], [place]: [phone] or [phone],  [email or web address]  Dental Assistance  If unable to pay or uninsured, contact:  Health Serve or [facility]. to become qualified for the adult dental clinic.  Patients with Medicaid: [facility] [phone] [address] [place], [place] [address] [phone]  If unable to pay, or uninsured, contact [facility] [phone]) or [place] [phone] in [place], [place] in [facility]) to become qualified for the adult dental clinic  Other [place]- [place], [place], [place], [address], [place], [place], 2nd and 4th Thursday of the month at 6:30am.  10 clients each day by appointment, can sometimes see walk-in patients if someone does not show for an appointment. [facility]- [address] Hillard Danker [place], [place], [address], [place], [place], [place], [address], [place] [facility] [phone]  Please make every effort to establish with a primary care physician for routine medical care  [place]  The [place] provides a wide range of adult health services. Some of these services are designed to address the healthcare needs of all [facility] residents and all services are designed to meet the  needs of uninsured/underinsured low income residents. Some services are available to any resident of [place], call [phone] for details. ] The [facility], a new medical clinic for adults, is now open. For more information about the Center and its services please call [phone]. For information on our [place] services, click here.  For more information on any of the following Department of Public Health  programs, including hours of service, click on the highlighted link.  SERVICES FOR WOMEN (Adults and Teens) [employer] provide a full range of birth control options plus education and counseling. New patient visit and annual return visits include a complete examination, pap test as indicated, and other laboratory as indicated. Included is our [employer] for men.  Maternity Care is provided through pregnancy, including a six week post partum exam. Women who meet eligibility criteria for the Medicaid for Pregnant Women program, receive care free. Other women are charged on a sliding scale according to income. Note: [facility] provides services to pregnant women who have a Medicaid card. Call [phone] for an appointment in [place] or [phone] for an appointment in [facility].  Primary Care for Medicaid [place] Access Women is available through the [place]. As primary care provider for the [place] program, women may designate the [facility] clinic as their primary care provider.  PLEASE CALL [phone] FOR AN APPOINTMENT FOR THE ABOVE SERVICES IN EITHER [place] OR [place]. Information available in [place] and [place].   Childbirth Education Classes are open to the public and offered to help families prepare for the best possible childbirth experience as well as to promote lifelong health and wellness. Classes are offered throughout the year and meet on the same night once a week for five weeks. Medicaid covers the cost of the classes for the mother-to-be and her partner. For participants without Medicaid, the cost of the class series is $45.00 for the mother-to-be and her partner. Class size is limited and registration is required. For more information or to register call [phone]. Baby items donated by [employer] and the [employer] [place] are given away  during each class series.  SERVICES FOR WOMEN AND MEN Sexually Transmitted Infection appointments, including HIV testing, are available daily (weekdays, except holidays). Call early as same-day appointments are limited. For an appointment in either [facility] or [place], call [phone]. Services are confidential and free of charge.  Skin Testing for Tuberculosis Please call [phone]. Adult Immunizations are available, usually for a fee. Please call [phone] for details.  PLEASE CALL [phone] FOR AN APPOINTMENT FOR THE ABOVE SERVICES IN EITHER [place] OR [place].   [facility] provides up to the minute recommended vaccines for your travel destination. We also provide essential health and political information to help insure a safe and pleasurable travel experience. This program is self-sustaining, however, fees are very competitive. We are a CERTIFIED YELLOW FEVER IMMUNIZATION approved clinic site. PLEASE CALL [phone] FOR AN APPOINTMENT IN EITHER  OR [place].   If you have questions about the services listed above, we want to answer them! Email [place] at: [email or web address] Home Visiting Services for elderly and the disabled are available to residents of [facility] who are in need of care that compares to the care offered by a nursing home, have needs that can be met by the program, and have [place] Medicaid. Other short term services are available to residents  18 years and older who are unable to meet requirements for eligibility to receive services from a certified home health agency, spend the majority of time at home, and need care for six months or less.  PLEASE CALL [id number] OR [phone] FOR MORE INFORMATION. Medication Assistance Program serves as a link between pharmaceutical companies and patients to provide low cost or free prescription medications. This servce is available for residents who meet certain income restrictions and have no insurance  coverage.  PLEASE CALL [phone] ([place]) OR [phone] ([place]) FOR MORE INFORMATION.  Updated Feb. 21, 2013   .

## 2015-09-30 ENCOUNTER — Emergency Department (HOSPITAL_COMMUNITY)
Admission: EM | Admit: 2015-09-30 | Discharge: 2015-09-30 | Disposition: A | Discharge status: Home / Self Care | Payer: Self-pay | Attending: Emergency Medicine | Admitting: Emergency Medicine

## 2015-09-30 ENCOUNTER — Encounter (HOSPITAL_COMMUNITY): Payer: Self-pay | Admitting: Emergency Medicine

## 2015-09-30 DIAGNOSIS — L02413 Cutaneous abscess of right upper limb: Secondary | ICD-10-CM | POA: Insufficient documentation

## 2015-09-30 DIAGNOSIS — L0291 Cutaneous abscess, unspecified: Secondary | ICD-10-CM

## 2015-09-30 DIAGNOSIS — L03113 Cellulitis of right upper limb: Secondary | ICD-10-CM | POA: Insufficient documentation

## 2015-09-30 DIAGNOSIS — L0390 Cellulitis, unspecified: Secondary | ICD-10-CM

## 2015-09-30 DIAGNOSIS — F17210 Nicotine dependence, cigarettes, uncomplicated: Secondary | ICD-10-CM | POA: Insufficient documentation

## 2015-09-30 LAB — CBC WITH DIFFERENTIAL/PLATELET
BASOS ABS: 0 10*3/uL (ref 0.0–0.1)
Basophils Relative: 0 %
EOS ABS: 0.4 10*3/uL (ref 0.0–0.7)
EOS PCT: 3 %
HCT: 39.7 % (ref 39.0–52.0)
HEMOGLOBIN: 13 g/dL (ref 13.0–17.0)
LYMPHS ABS: 1.7 10*3/uL (ref 0.7–4.0)
LYMPHS PCT: 14 %
MCH: 28.4 pg (ref 26.0–34.0)
MCHC: 32.7 g/dL (ref 30.0–36.0)
MCV: 86.9 fL (ref 78.0–100.0)
Monocytes Absolute: 1.5 10*3/uL — ABNORMAL HIGH (ref 0.1–1.0)
Monocytes Relative: 13 %
NEUTROS PCT: 70 %
Neutro Abs: 8.3 10*3/uL — ABNORMAL HIGH (ref 1.7–7.7)
PLATELETS: 268 10*3/uL (ref 150–400)
RBC: 4.57 MIL/uL (ref 4.22–5.81)
RDW: 13.7 % (ref 11.5–15.5)
WBC: 11.9 10*3/uL — AB (ref 4.0–10.5)

## 2015-09-30 LAB — BASIC METABOLIC PANEL
ANION GAP: 9 (ref 5–15)
BUN: 10 mg/dL (ref 6–20)
CHLORIDE: 102 mmol/L (ref 101–111)
CO2: 27 mmol/L (ref 22–32)
Calcium: 8.6 mg/dL — ABNORMAL LOW (ref 8.9–10.3)
Creatinine, Ser: 1.61 mg/dL — ABNORMAL HIGH (ref 0.61–1.24)
GFR calc Af Amer: >60 mL/min (ref >60)
GFR, EST NON AFRICAN AMERICAN: 54 mL/min — AB (ref >60)
Glucose, Bld: 116 mg/dL — ABNORMAL HIGH (ref 65–99)
POTASSIUM: 3.5 mmol/L (ref 3.5–5.1)
SODIUM: 138 mmol/L (ref 135–145)

## 2015-09-30 MED ORDER — SULFAMETHOXAZOLE-TRIMETHOPRIM 800-160 MG PO TABS: 1.0000 | ORAL_TABLET | Freq: Two times a day (BID) | ORAL | Status: AC

## 2015-09-30 MED ORDER — ONDANSETRON HCL 4 MG/2ML IJ SOLN
4.0000 mg | Freq: Once | INTRAMUSCULAR | Status: AC
  Administered 2015-09-30: 4 mg via INTRAVENOUS
  Filled 2015-09-30: qty 2

## 2015-09-30 MED ORDER — SODIUM CHLORIDE 0.9 % IV BOLUS (SEPSIS)
1000.0000 mL | Freq: Once | INTRAVENOUS | Status: AC
  Administered 2015-09-30: 1000 mL via INTRAVENOUS

## 2015-09-30 MED ORDER — LIDOCAINE-EPINEPHRINE (PF) 2 %-1:200000 IJ SOLN
20.0000 mL | Freq: Once | INTRAMUSCULAR | Status: AC
  Administered 2015-09-30: 20 mL via INTRADERMAL

## 2015-09-30 MED ORDER — VANCOMYCIN HCL IN DEXTROSE 1-5 GM/200ML-% IV SOLN
1000.0000 mg | Freq: Once | INTRAVENOUS | Status: AC
Start: 2015-09-30 — End: 2015-09-30
  Administered 2015-09-30: 1000 mg via INTRAVENOUS
  Filled 2015-09-30: qty 200

## 2015-09-30 MED ORDER — KETOROLAC TROMETHAMINE 30 MG/ML IJ SOLN
30.0000 mg | Freq: Once | INTRAMUSCULAR | Status: AC
  Administered 2015-09-30: 30 mg via INTRAVENOUS
  Filled 2015-09-30: qty 1

## 2015-09-30 MED ORDER — LIDOCAINE-EPINEPHRINE (PF) 2 %-1:200000 IJ SOLN
INTRAMUSCULAR | Status: AC
  Administered 2015-09-30: 20 mL via INTRADERMAL
  Filled 2015-09-30: qty 20

## 2015-09-30 NOTE — Discharge Instructions (Signed)
Abscess °An abscess is an infected area that contains a collection of pus and debris. It can occur in almost any part of the body. An abscess is also known as a furuncle or boil. °CAUSES  °An abscess occurs when tissue gets infected. This can occur from blockage of oil or sweat glands, infection of hair follicles, or a minor injury to the skin. As the body tries to fight the infection, pus collects in the area and creates pressure under the skin. This pressure causes pain. People with weakened immune systems have difficulty fighting infections and get certain abscesses more often.  °SYMPTOMS °Usually an abscess develops on the skin and becomes a painful mass that is red, warm, and tender. If the abscess forms under the skin, you may feel a moveable soft area under the skin. Some abscesses break open (rupture) on their own, but most will continue to get worse without care. The infection can spread deeper into the body and eventually into the bloodstream, causing you to feel ill.  °DIAGNOSIS  °Your caregiver will take your medical history and perform a physical exam. A sample of fluid may also be taken from the abscess to determine what is causing your infection. °TREATMENT  °Your caregiver may prescribe antibiotic medicines to fight the infection. However, taking antibiotics alone usually does not cure an abscess. Your caregiver may need to make a small cut (incision) in the abscess to drain the pus. In some cases, gauze is packed into the abscess to reduce pain and to continue draining the area. °HOME CARE INSTRUCTIONS  °· Only take over-the-counter or prescription medicines for pain, discomfort, or fever as directed by your caregiver. °· If you were prescribed antibiotics, take them as directed. Finish them even if you start to feel better. °· If gauze is used, follow your caregiver's directions for changing the gauze. °· To avoid spreading the infection: °· Keep your draining abscess covered with a  bandage. °· Wash your hands well. °· Do not share personal care items, towels, or whirlpools with others. °· Avoid skin contact with others. °· Keep your skin and clothes clean around the abscess. °· Keep all follow-up appointments as directed by your caregiver. °SEEK MEDICAL CARE IF:  °· You have increased pain, swelling, redness, fluid drainage, or bleeding. °· You have muscle aches, chills, or a general ill feeling. °· You have a fever. °MAKE SURE YOU:  °· Understand these instructions. °· Will watch your condition. °· Will get help right away if you are not doing well or get worse. °  °This information is not intended to replace advice given to you by your health care provider. Make sure you discuss any questions you have with your health care provider. °  °Document Released: 12/22/2004 Document Revised: 09/13/2011 Document Reviewed: 05/27/2011 °Elsevier Interactive Patient Education ©2016 Elsevier Inc. ° °Cellulitis °Cellulitis is an infection of the skin and the tissue beneath it. The infected area is usually red and tender. Cellulitis occurs most often in the arms and lower legs.  °CAUSES  °Cellulitis is caused by bacteria that enter the skin through cracks or cuts in the skin. The most common types of bacteria that cause cellulitis are staphylococci and streptococci. °SIGNS AND SYMPTOMS  °· Redness and warmth. °· Swelling. °· Tenderness or pain. °· Fever. °DIAGNOSIS  °Your health care provider can usually determine what is wrong based on a physical exam. Blood tests may also be done. °TREATMENT  °Treatment usually involves taking an antibiotic medicine. °HOME CARE INSTRUCTIONS  °·   Take your antibiotic medicine as directed by your health care provider. Finish the antibiotic even if you start to feel better.  Keep the infected arm or leg elevated to reduce swelling.  Apply a warm cloth to the affected area up to 4 times per day to relieve pain.  Take medicines only as directed by your health care  provider.  Keep all follow-up visits as directed by your health care provider. SEEK MEDICAL CARE IF:   You notice red streaks coming from the infected area.  Your red area gets larger or turns dark in color.  Your bone or joint underneath the infected area becomes painful after the skin has healed.  Your infection returns in the same area or another area.  You notice a swollen bump in the infected area.  You develop new symptoms.  You have a fever. SEEK IMMEDIATE MEDICAL CARE IF:   You feel very sleepy.  You develop vomiting or diarrhea.  You have a general ill feeling (malaise) with muscle aches and pains.   This information is not intended to replace advice given to you by your health care provider. Make sure you discuss any questions you have with your health care provider.   Document Released: 12/22/2004 Document Revised: 12/03/2014 Document Reviewed: 05/30/2011 Elsevier Interactive Patient Education [employer]   To find a primary care or specialty doctor please call [phone] or [phone] to access "[place] Find a Doctor Service."  You may also go on the [facility] website at [email or web address]  There are also multiple [facility] [place] [facility] practices throughout the [place] that are frequently accepting new patients. You may find a clinic that is close to your home and contact them.  [facility] -  [address] [place] [address] [phone]  [facility] in [place] (also locations in [place] and [place]) -  [address] [place] [address] [phone]  [facility] Department -  [address] [place] [address] [phone]   Community Resource Guide Outpatient Counseling/Substance Abuse Adult The United Ways 211 is a great source of information about community services available.  Access by dialing 2-1-1 from anywhere in  [place], or by website -  PooledIncome.pl.   Other Local Resources (Updated 03/2015)  Crisis Hotlines   Services     Area Served  [employer]  Crisis Hotline, available 24 hours a day, 7 days a week: 719-557-7161 [facility] [place]   [facility]  Crisis Hotline, available 24 hours a day, 7 days a week: (432)331-8158 [facility], [place]  [facility]  Suicide Prevention Hotline, available 24 hours a day, 7 days a week: 226-679-3101 [facility], [place]  [employer], available 24 hours a day, 7 days a week: [phone] [facility], [place]   [facility] Access to [employer], available 24 hours a day, 7 days a week: ([phone] [phone] All   [facility]  Crisis Hotline, available 24 hours a day, 7 days a week: 779-504-5020 All   Other Local Resources (Updated 03/2015)  Outpatient Counseling/ Substance Abuse Programs  Services     Address and Phone Number  ADS (Alcohol and Drug Services)   Options include Individual counseling, group counseling, intensive outpatient program (several hours a day, several days a week)  Offers depression assessments  Provides methadone maintenance program [phone] [address] [place], [place] [address]   [facility]  Offers partial hospitalization/day treatment and DUI/DWI programs  Accepts Medicare, private insurance [phone] [address] [place], [place] [address]  [facility]    Services include intensive outpatient program (several hours a day, several days a week), outpatient treatment, DUI/DWI services, family education  Also has some services specifically for [employer] transitional housing  [phone] [address] [place], [place] [address]     [facility]  Accepts Medicare, private pay, and private insurance [phone] [address] [place], [place] [address]  [employer] of Care  Services include individual counseling, substance abuse intensive outpatient program (several hours a day, several days a week), day treatment  Delene Loll, Medicaid, private insurance [phone] [address], [address] [place], [place] [address]  [facility]   Offers substance abuse intensive outpatient program (several hours a day, several days a week), partial hospitalization program [phone] [address] [place], [place] [address]  [phone] [address] [place], [place] [address]  [phone] [address] [place], [place] [address]  [phone] [id number], [address] [place], [place] [address]  [facility]  Individual counseling only  Accepts private insurance only [phone] [address] [place], [place] [address]  [facility]  Methadone maintenance program [phone] [address] [place], [place] [address]  [facility]  Walk-In Clinic providing substance abuse and mental health counseling  Accepts Medicaid, Medicare, private insurance  Offers sliding scale for uninsured [phone] [address] [place], [place]   [facility] [place], [employer].  Offers individual counseling, and intensive in-home services [phone] [address], [address] [place], [place] [address]  [facility] individual counseling, family counseling, group therapy, domestic violence counseling, consumer credit counseling  Accepts Medicare, Medicaid, private insurance  Offers sliding scale for uninsured [phone] [address] [place], [place] [address]  [phone] [facility], [address] [place], [place] [address]  [facility]  Offers individual, family and group counseling  3 locations - [place], [place], and [place]  [phone]  [address] [place], [place] [address]  [address] [place], [place] [address]  [address] [place], [place] [address]  [facility] Margo Aye    Offers psychiatric assessment, 8-week Intensive Outpatient Program (several hours a day, several times a week, daytime or evenings), early recovery group, family Program, medication management  Private pay or private insurance only [phone], or  [phone] [address] [place], [place] [address]  [place] [employer] individual, couples and family counseling  Accepts Medicaid, private insurance, and sliding scale for uninsured [phone] [address] [place], [place] [address]  Len Blalock, MD  Individual counseling  Private insurance [phone] [address] [place], [place] [address]  [facility]   Offers assessment, substance abuse treatment, and behavioral health treatment [phone] [address] [place], [place] [address]  [facility]  Individual counseling  Accepts private insurance [phone] [address] [place], [place] [address]  [facility]  Individual counseling  Delene Loll, private insurance [phone] [address] [place], [place] [address]  [facility]    Offers intensive outpatient program (several hours a day, several times a week)  Private pay, private insurance [phone] [facility] [place], [place]  [facility]  Individual counseling  Medicare, private insurance [phone] [address] [place], [place] [address]  [facility]    Offers intensive outpatient program (several hours a day, several times a week) and partial hospitalization program [phone] [address] [place], [place] [address]  Emerson MonteParrish McKinney, MD  Individual counseling [phone] [address], [address] [place], [place] [address]   [facility]  Offers Christian counseling to individuals, couples, and families  Accepts Medicare and private insurance; offers sliding scale for uninsured [phone] [address] [place], [place] [address]  [facility]  [facility] counseling [phone] [address], [address] [place], [place] [address]  [facility]   Offers crisis counseling, individual counseling, group therapy, in-home therapy, domestic violence services, day treatment, DWI services, Administrator, artsCommunity Support Team (CST), Doctor, hospitalAssertive Community Treatment Team (ACTT), substance abuse Intensive Outpatient Program (several hours a day, several times a week)  2 locations - [place] and [place] [phone] [address] [place], [place] [address]  [phone] [address] [place], [place] [address]  [facility]     Individual counseling and group therapy  Crown Holdingsccepts private insurance, C[place]Medicare, [place]icaid [phone] [address] [place], [place]  [facility]  Offers individual and family counseling  Offers LGBTQ services  Accepts private insurance and private pay [phone] [address] [place], [place] [address]  [facility]    Offers individual counseling, group therapy, and outpatient detox  Accepts private insurance [phone] [address] [place], [place]  [facility]  Individual counseling  Accepts Medicare, private insurance [phone] [address], [address] [place], [place] [address]  [facility]  Individual counseling  Accepts Medicare, private insurance [phone] [address] [place], [place] [address]  [facility] ButtnerZephaniah Services [facility]   Offers substance abuse Intensive Outpatient Program (several hours a day, several times a week) [phone], or [phone] [place], [place]    State Street CorporationCommunity Resource Guide Inpatient Behavioral  Health/Residential  Substance Abuse Treatment Adults The United Ways 211 is a great source of information about community services available.  Access by dialing 2-1-1 from anywhere in [place], or by website -  PooledIncome.plwww.nc211.org.   (Updated 03/2015)  Crisis Assistance 24 hours a day   Services Offered    Area Lock[employer] MartinServed  [facility]  24-hour crisis assistance: [phone] [place], [place]   [facility]  24-hour crisis assistance:[phone] [place], [place]  [facility]   24-hour crisis assistance: [phone] [place], [place]   [facility] Access to Care Line  24-hour crisis assistance; [phone] All   [facility]atives  24-hour crisis response line: [phone] All   Other Local Resources (Updated 03/2015)  Inpatient Behavioral Health/Residential Substance Abuse Treatment Programs   Services      Address and Phone Number  [facility] ([facility])   14-day residential rehabilitation  [phone] [address] [place], [place]  [facility] ([facility])    Detox - private pay only  14-day residential rehabilitation -  Medicaid, insurance, private pay only [phone], or [phone] [address], [place], [place] [address]   [facility]  Private Insurance only  Multiple facilities [phone] admissions   [facility] ([facility])   90-day program  Must be homeless to participate  [phone], or [phone] [place]cy [place], [place] [facility]  [facility]     Private Insurance only [phone], or  [phone] [address] [place], [place] [address]  [facility]     Must make an appointment  Transportation is offered from [employer] on [address]  Accepts private pay, Sheryn BisonMedicare, [facility] Medicaid [phone]  [address] [place], [place] [address]   [employer]  Females  only  Associated with the [facility] [phone] [address] [place], [place] [address]  [facility]   Private insurance only [phone], or [phone] [address] [place], [id number]  [facility]    Detox  Residential rehabilitation  Private insurance only  Multiple locations [phone] admissions  [facility]    Private pay  Private insurance [phone] [address] [place], [place] [address]  [facility]    Males only  Fee required at time of admission [phone] [address] [place], [place] [address]  [facility]    Private pay only  [phone] [place], [place]  RTS (Residential Treatment Services)    Detox - private pay, Medicaid  Residential rehabilitation for males  - Medicare, Medicaid, insurance, private pay [phone] [address] [place], [place]   [facility]    Walk-in interviews Monday - Saturday from 8 am - 4 pm  Individuals with legal charges are not eligible [phone] [address] [place], [place] [address]  [facility]   Must be willing to work  Must attend Alcoholics Anonymous meetings [phone] [address] [place], [place]   [facility]    Faith-based program  Private pay only [phone] [address] [place], [place]

## 2015-09-30 NOTE — ED Provider Notes (Signed)
TIME SEEN: 4:50 AM  CHIEF COMPLAINT: Right arm cellulitis  HPI: pt is a 35 y.o. male with history of heroin abuse who presents to the emergency department with complaints of several days of right arm swelling and pain. Denies any fevers but has had chills and nausea. No vomiting or diarrhea. Is not a diabetic. Does have a history of IV drug abuse but states that he has not used IV drugs in some time. Was recently seen in the emergency department for heroin overdose and states at that time he was snorting heroin. Reports that he never injects drugs into his right arm because he is right arm dominant. Reports that he always injects heroin into his left arm. No history of previous abscesses.  ROS: See HPI Constitutional: no fever  Eyes: no drainage  ENT: no runny nose   Cardiovascular:  no chest pain  Resp: no SOB  GI: no vomiting GU: no dysuria Integumentary: no rash  Allergy: no hives  Musculoskeletal: no leg swelling  Neurological: no slurred speech ROS otherwise negative  PAST MEDICAL HISTORY/PAST SURGICAL HISTORY:  Past Medical History  Diagnosis Date  . Urticaria   . Inguinal hernia     MEDICATIONS:  Prior to Admission medications   Medication Sig Start Date End Date Taking? Authorizing Provider  acetaminophen (TYLENOL) 500 MG tablet Take 1,000 mg by mouth every 6 (six) hours as needed for moderate pain.    Historical Provider, MD  HYDROcodone-acetaminophen (NORCO/VICODIN) 5-325 MG tablet Take 1 tablet by mouth every 6 (six) hours as needed. Patient not taking: Reported on 09/22/2015 09/16/15   Samantha Tripp Dowless, PA-C  ibuprofen (ADVIL,MOTRIN) 600 MG tablet Take 1 tablet (600 mg total) by mouth every 8 (eight) hours as needed. Patient not taking: Reported on 09/16/2015 09/09/15   Danelle BerryLeisa Tapia, PA-C    ALLERGIES:  No Known Allergies  SOCIAL HISTORY:  Social History  Substance Use Topics  . Smoking status: Current Every Day Smoker -- 0.00 packs/day    Types: Cigarettes   . Smokeless tobacco: Never Used  . Alcohol Use: Yes    FAMILY HISTORY: No family history on file.  EXAM: BP 132/74 mmHg  Pulse 92  Temp(Src) 99.1 F (37.3 C) (Oral)  Resp 16  Ht 6\' 2"  (1.88 m)  Wt 190 lb (86.183 kg)  BMI 24.38 kg/m2  SpO2 98% CONSTITUTIONAL: Alert and oriented and responds appropriately to questions. Well-appearing; well-nourished HEAD: Normocephalic EYES: Conjunctivae clear, PERRL ENT: normal nose; no rhinorrhea; moist mucous membranes NECK: Supple, no meningismus, no LAD  CARD: RRR; S1 and S2 appreciated; no murmurs, no clicks, no rubs, no gallops RESP: Normal chest excursion without splinting or tachypnea; breath sounds clear and equal bilaterally; no wheezes, no rhonchi, no rales, no hypoxia or respiratory distress, speaking full sentences ABD/GI: Normal bowel sounds; non-distended; soft, non-tender, no rebound, no guarding, no peritoneal signs BACK:  The back appears normal and is non-tender to palpation, there is no CVA tenderness EXT: Normal ROM in all joints; non-tender to palpation; no edema; normal capillary refill; no cyanosis, no calf tenderness or swelling, compartments are soft, no joint effusion, 2+ radial pulses bilaterally    SKIN: Normal color for age and race; warm; patient has a large area of erythema and warmth noted to the inner aspect of the upper right arm that has been outlined with a tissue marker with a 3 x 3 cm indurated fluctuant area without drainage NEURO: Moves all extremities equally, sensation to light touch intact diffusely, cranial  nerves II through XII intact PSYCH: The patient's mood and manner are appropriate. Grooming and personal hygiene are appropriate.  MEDICAL DECISION MAKING: Patient here with area of cellulitis and a large abscess to the right upper arm. He is slightly tachycardic but otherwise hemodynamically stable and nontoxic appearing. Reports chills, nausea. We'll give Toradol, IV fluids, Zofran, vancomycin. Will  check labs. I have incised and drained this abscess with a large amount of foul-smelling purulent drainage. Area was opened and loculations broken up. I have packed the area with iodoform and advised him to return here or to urgent care or his PCP in 48-72 hours for wound recheck and have his packing removed.  ED PROGRESS: Patient's labs show mild leukocytosis with left shift. He has mildly elevated creatinine which appears to be his baseline. Have recommended he continue Tylenol at home for pain. I do not feel narcotics are indicated especially given his history of opiate abuse. Have again discussed with him return precautions and recommend return in 48-72 hours to have his wound rechecked in packing removed. We'll give him outpatient PCP follow-up information. Have also given him outpatient substance abuse information.  At this time, I do not feel there is any life-threatening condition present. I have reviewed and discussed all results (EKG, imaging, lab, urine as appropriate), exam findings with patient. I have reviewed nursing notes and appropriate previous records.  I feel the patient is safe to be discharged home without further emergent workup. Discussed usual and customary return precautions. Patient and family (if present) verbalize understanding and are comfortable with this plan.  Patient will follow-up with their primary care provider. If they do not have a primary care provider, information for follow-up has been provided to them. All questions have been answered.   INCISION AND DRAINAGE Performed by: Raelyn NumberWARD, KRISTEN N Consent: Verbal consent obtained. Risks and benefits: risks, benefits and alternatives were discussed Type: abscess  Body area: Right upper arm  Anesthesia: local infiltration  Incision was made with a scalpel.  Local anesthetic: lidocaine 2 % with epinephrine  Anesthetic total: 8 ml  Complexity: complex Blunt dissection to break up loculations  Drainage:  purulent  Drainage amount: Large   Packing material: 1/4 in iodoform gauze  Patient tolerance: Patient tolerated the procedure well with no immediate complications.    Layla MawKristen N Ward, DO 09/30/15 [phone]

## 2015-09-30 NOTE — ED Notes (Signed)
Pt. reports worsening cellulitis at right upper arm onset last week , denies injury / no drainage or fever .

## 2017-08-01 IMAGING — CT CT ABD-PELV W/ CM
2 of 4 series · 16 of 46 positions shown, 18 images · IV contrast (iopamidol)
Comparison: None.

CLINICAL DATA: Abdominal pain.  Known right inguinal hernia.

EXAM:
CT ABDOMEN AND PELVIS WITH CONTRAST
TECHNIQUE: Multidetector CT imaging of the abdomen and pelvis was performed
using the standard protocol following bolus administration of
intravenous contrast.
CONTRAST:  1 2JICY3-F11 IOPAMIDOL (2JICY3-F11) INJECTION 61%

[Series 2: a/p w/ 5mm · axial · 0.72mm/px · z∈[-544,-130]mm · 13 of 95 slices shown, 15 images]
[im 6/95  soft-tissue]
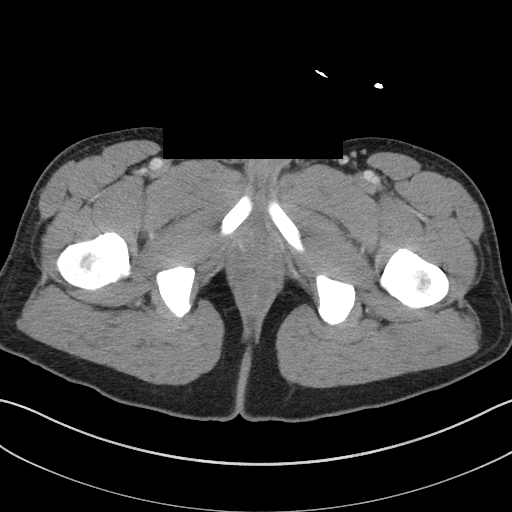
[im 6/95  bone]
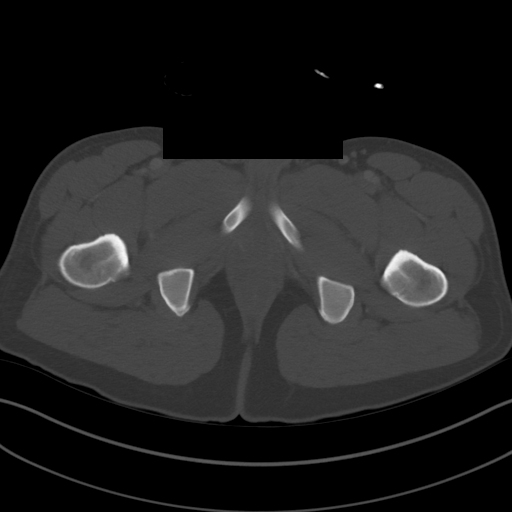
[im 12/95  soft-tissue]
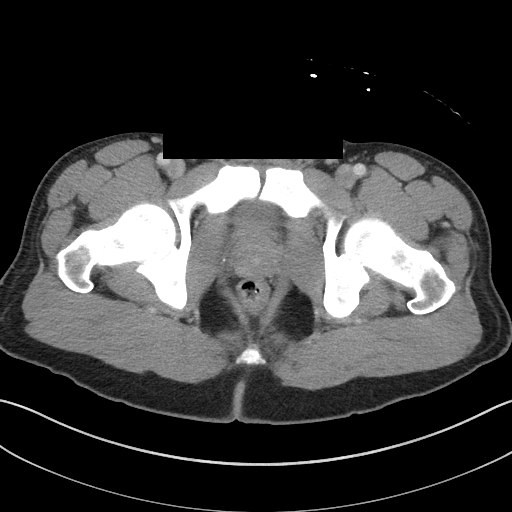
[im 18/95  soft-tissue]
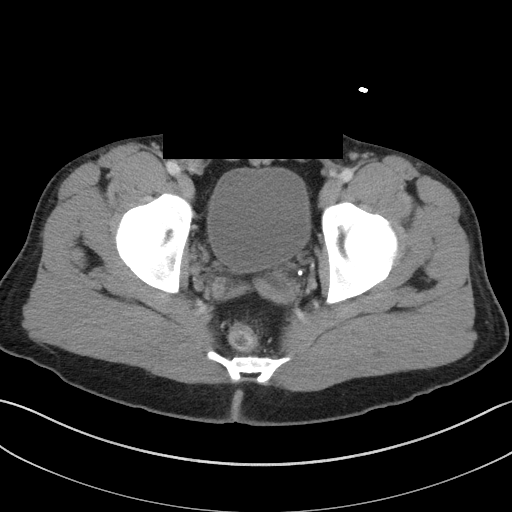
[im 30/95  soft-tissue]
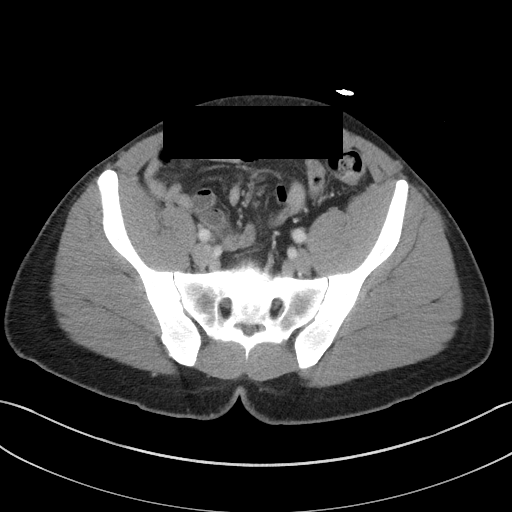
[im 36/95  soft-tissue]
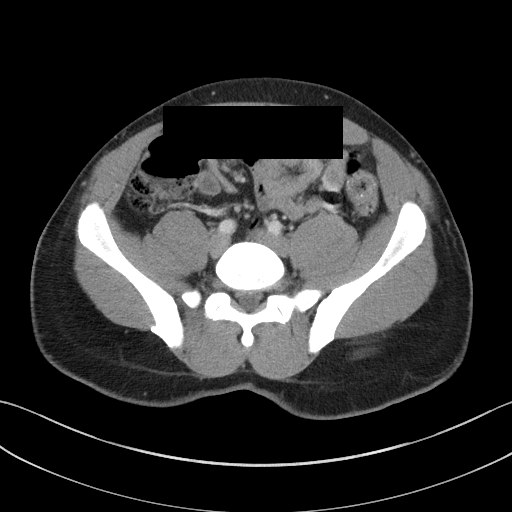
[im 42/95  soft-tissue]
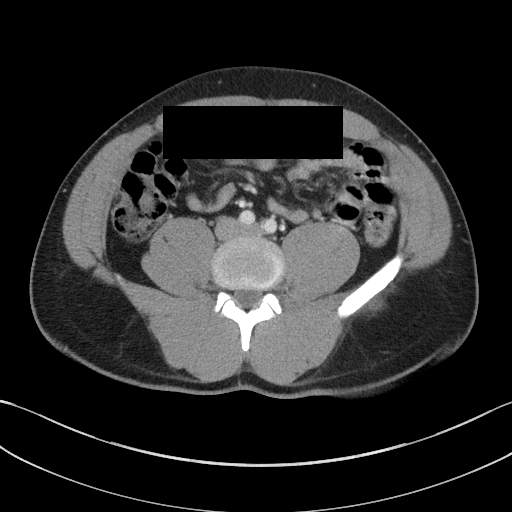
[im 48/95  soft-tissue]
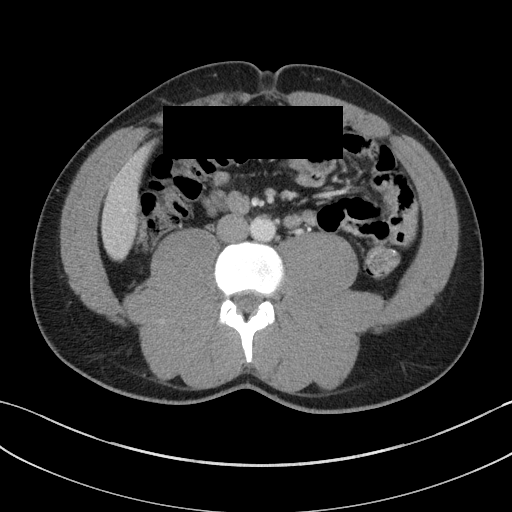
[im 53/95  soft-tissue]
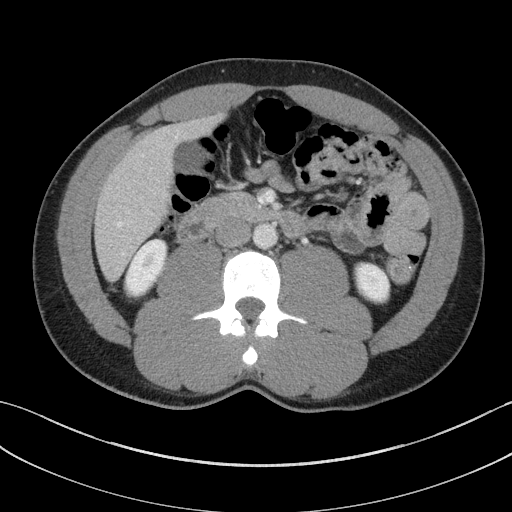
[im 59/95  soft-tissue]
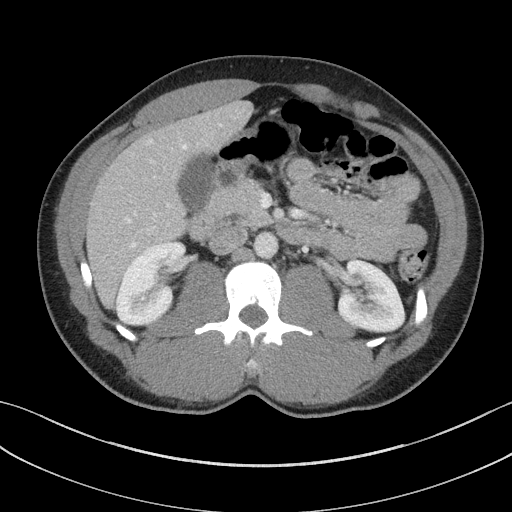
[im 59/95  bone]
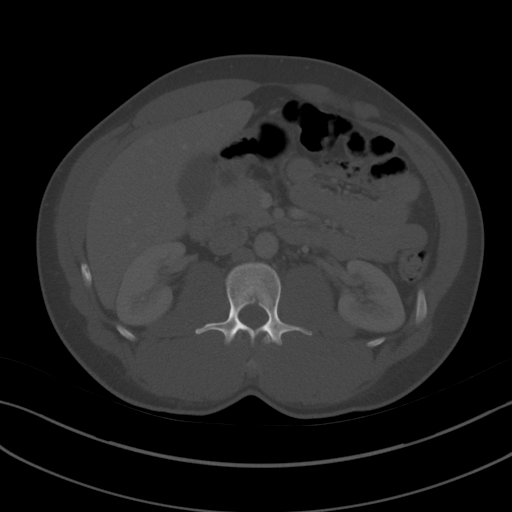
[im 65/95  soft-tissue]
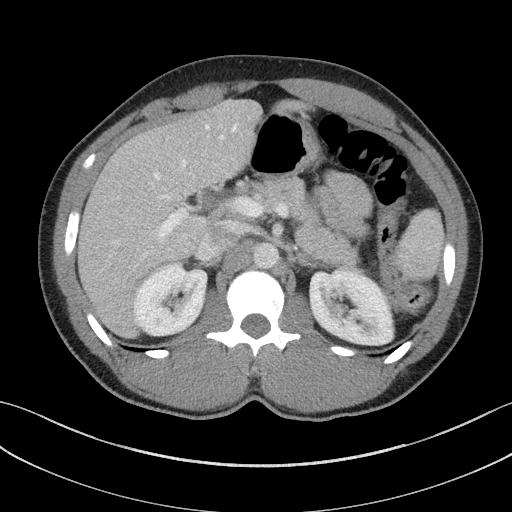
[im 77/95  soft-tissue]
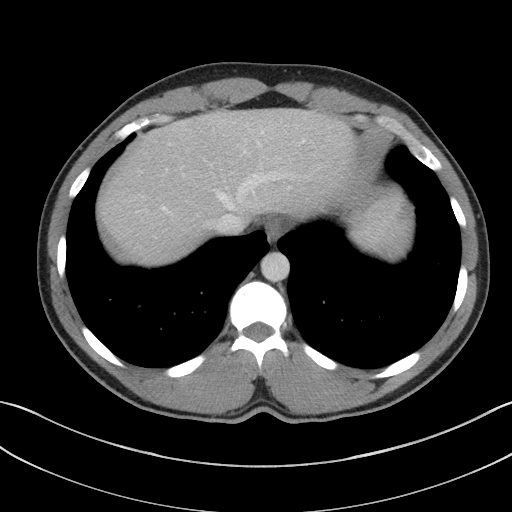
[im 83/95  soft-tissue]
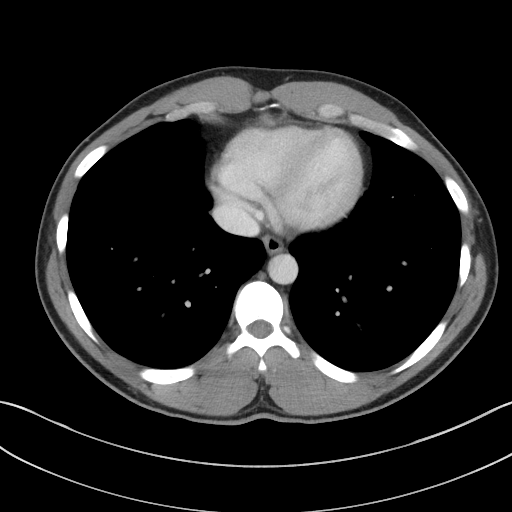
[im 89/95  soft-tissue]
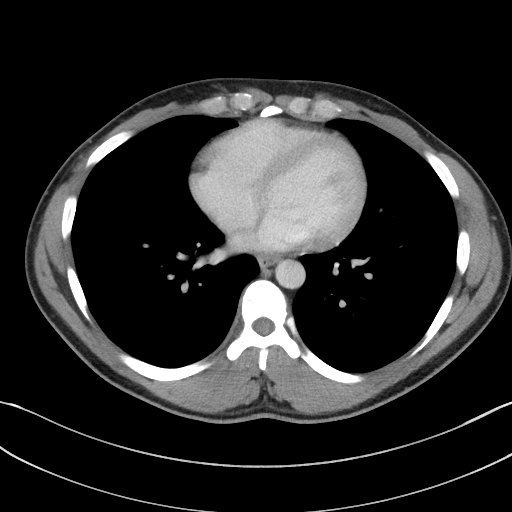

[Series 5: a/p w/ cor · coronal · 0.75mm/px · 3 of 122 slices shown]
[im 41/122  soft-tissue]
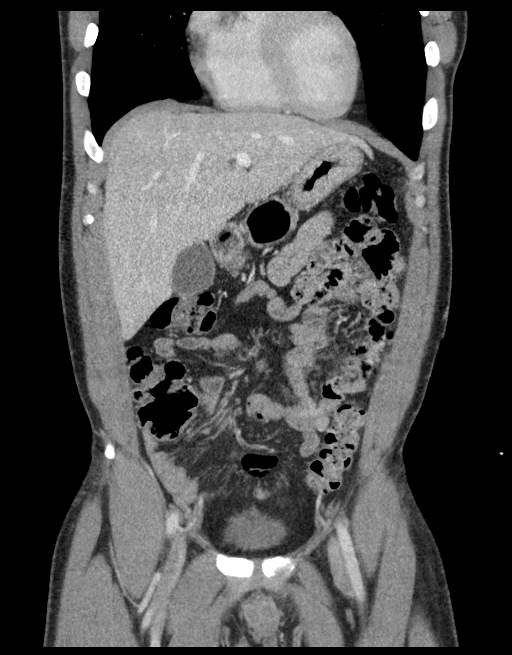
[im 54/122  soft-tissue]
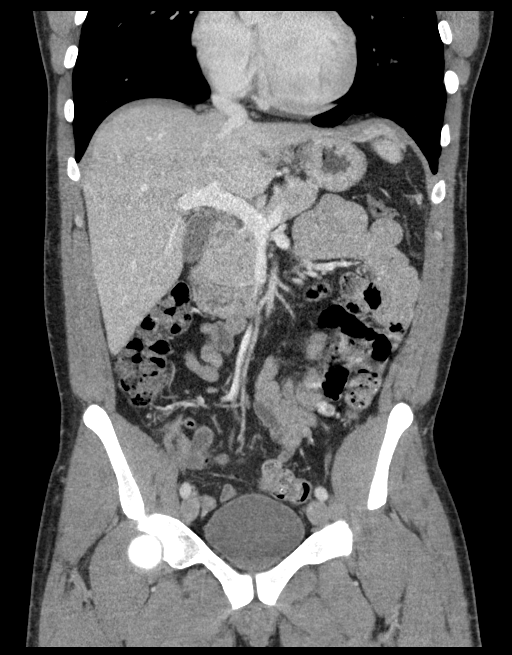
[im 68/122  soft-tissue]
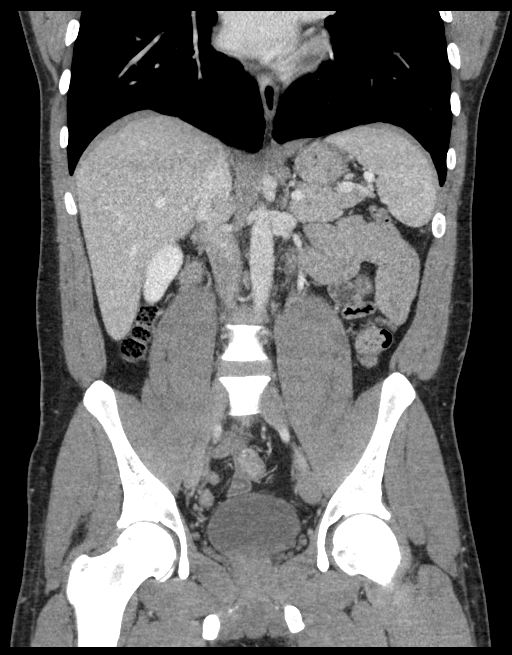

[16 of 46 positions shown; findings below may reference images not displayed]

FINDINGS: Lower chest:  The included lung bases are clear.

Liver: No focal lesion.

Hepatobiliary: Gallbladder physiologically distended, no calcified
stone. No biliary dilatation.

Pancreas: No ductal dilatation or inflammation.

Spleen: Normal.

Adrenal glands: No nodule.

Kidneys: Symmetric renal enhancement.  No hydronephrosis.

Stomach/Bowel: Stomach physiologically distended. There are no
dilated or thickened small bowel loops. Small to moderate volume of
stool throughout the colon without colonic wall thickening. The
appendix is normal.

Vascular/Lymphatic: No retroperitoneal adenopathy. Abdominal aorta
is normal in caliber. Trace atherosclerosis of the abdominal aorta.

Reproductive: Normal sized prostate gland.

Bladder: Physiologically distended, no wall thickening.

Other: No free air, free fluid, or intra-abdominal fluid collection.
Fat within the right inguinal canal without inflammation or bowel
involvement.

Musculoskeletal: There are no acute or suspicious osseous
abnormalities. Excrescence from the posterior right iliac bone may
be sequela of remote prior injury.
IMPRESSION: Fat within the right inguinal canal without bowel involvement or
inflammation.

No acute abnormality.

## 2017-08-14 IMAGING — CR DG ABDOMEN 1V
1 series · 1 of 1 positions shown · non-contrast
Comparison: Abdominal CT dated 08/20/2015

CLINICAL DATA: 35-year-old male with right lower quadrant abdominal
pain. History of hernia.

EXAM:
ABDOMEN - 1 VIEW

[t abdomen supine]
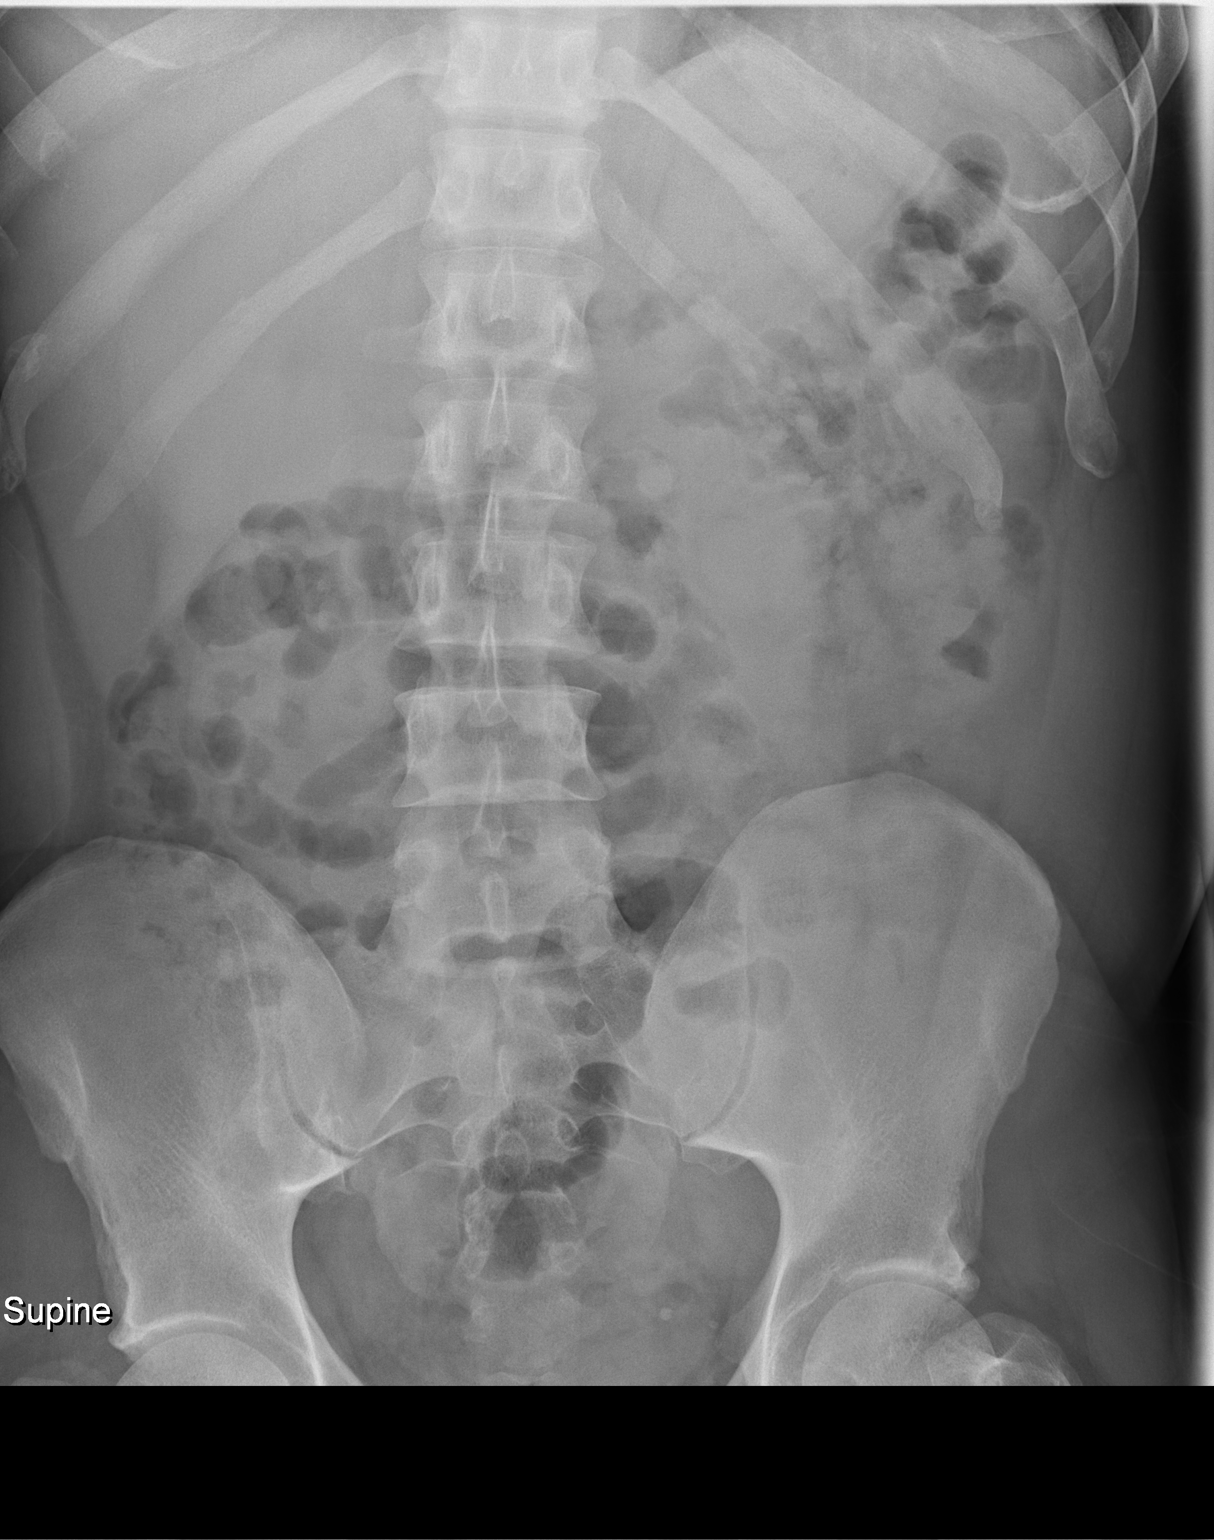

[1 of 1 positions shown; findings below may reference images not displayed]

FINDINGS: The bowel gas pattern is normal. No radio-opaque calculi or other
significant radiographic abnormality are seen.
IMPRESSION: Negative.

## 2018-01-21 ENCOUNTER — Emergency Department (HOSPITAL_COMMUNITY)
Admission: EM | Admit: 2018-01-21 | Discharge: 2018-01-21 | Disposition: A | Discharge status: Home / Self Care | Payer: Self-pay | Attending: Emergency Medicine | Admitting: Emergency Medicine

## 2018-01-21 NOTE — ED Triage Notes (Signed)
Patient was found in the parking of room to go in a car unresponsive in the car. Pt girl friend was in the car with him and call ems. Pt was narcan 1 mg ems.

## 2018-01-21 NOTE — ED Notes (Signed)
Bed: [facility] Expected date:  Expected time:  Means of arrival:  Comments: 

## 2022-06-27 DEATH — deceased
